# Patient Record
Sex: Female | Born: 1975 | Race: White | Hispanic: No | Marital: Married | Smoking: Never smoker
Health system: Southern US, Community
[De-identification: ages and names within clinical notes are randomized; demographics above are authoritative.]

## PROBLEM LIST (undated history)

## (undated) DIAGNOSIS — F329 Major depressive disorder, single episode, unspecified: Secondary | ICD-10-CM

## (undated) DIAGNOSIS — K219 Gastro-esophageal reflux disease without esophagitis: Secondary | ICD-10-CM

## (undated) DIAGNOSIS — F419 Anxiety disorder, unspecified: Secondary | ICD-10-CM

## (undated) DIAGNOSIS — G8929 Other chronic pain: Secondary | ICD-10-CM

## (undated) DIAGNOSIS — E119 Type 2 diabetes mellitus without complications: Secondary | ICD-10-CM

## (undated) DIAGNOSIS — F32A Depression, unspecified: Secondary | ICD-10-CM

## (undated) DIAGNOSIS — I10 Essential (primary) hypertension: Secondary | ICD-10-CM

## (undated) HISTORY — PX: ABDOMINAL HYSTERECTOMY: SHX81

## (undated) HISTORY — PX: CERVIX REMOVAL: SHX592

---

## 2004-12-06 ENCOUNTER — Other Ambulatory Visit: Payer: Self-pay

## 2004-12-06 ENCOUNTER — Emergency Department: Payer: Self-pay | Admitting: Emergency Medicine

## 2006-04-20 ENCOUNTER — Emergency Department: Payer: Self-pay | Admitting: Emergency Medicine

## 2007-01-03 ENCOUNTER — Inpatient Hospital Stay: Payer: Self-pay

## 2007-07-14 ENCOUNTER — Emergency Department: Payer: Self-pay | Admitting: Emergency Medicine

## 2007-07-15 ENCOUNTER — Emergency Department: Payer: Self-pay | Admitting: Emergency Medicine

## 2007-07-20 ENCOUNTER — Emergency Department: Payer: Self-pay | Admitting: Emergency Medicine

## 2008-04-29 ENCOUNTER — Ambulatory Visit: Payer: Self-pay | Admitting: Obstetrics & Gynecology

## 2008-05-06 ENCOUNTER — Ambulatory Visit: Payer: Self-pay | Admitting: Obstetrics & Gynecology

## 2008-11-29 ENCOUNTER — Emergency Department: Payer: Self-pay | Admitting: Emergency Medicine

## 2008-12-02 ENCOUNTER — Ambulatory Visit: Payer: Self-pay | Admitting: Obstetrics & Gynecology

## 2008-12-31 ENCOUNTER — Emergency Department: Payer: Self-pay

## 2009-01-25 ENCOUNTER — Ambulatory Visit: Payer: Self-pay | Admitting: Obstetrics & Gynecology

## 2009-01-27 ENCOUNTER — Ambulatory Visit: Payer: Self-pay | Admitting: Obstetrics & Gynecology

## 2010-08-24 IMAGING — US US PELV - US TRANSVAGINAL
1 series · 17 of 25 positions shown · non-contrast
Comparison: none

REASON FOR EXAM: persistant pelvic pain - cramping (like contractions),
hx of BTL, Uterine lining
COMMENTS:

[Series 1: us pelv - us transvaginal · 17 of 71 slices shown]
[im 1/71]
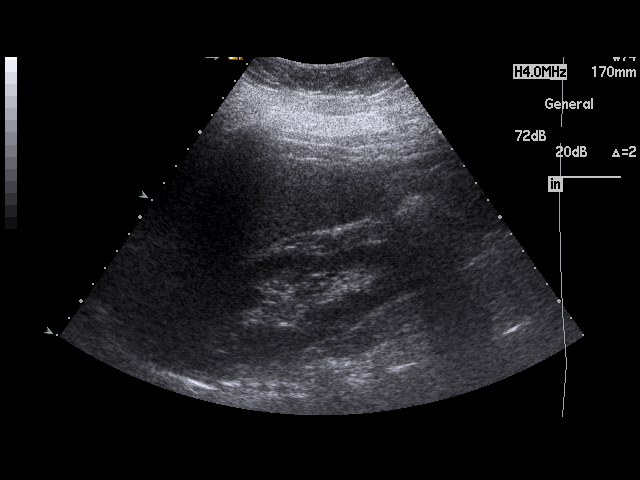
[im 6/71]
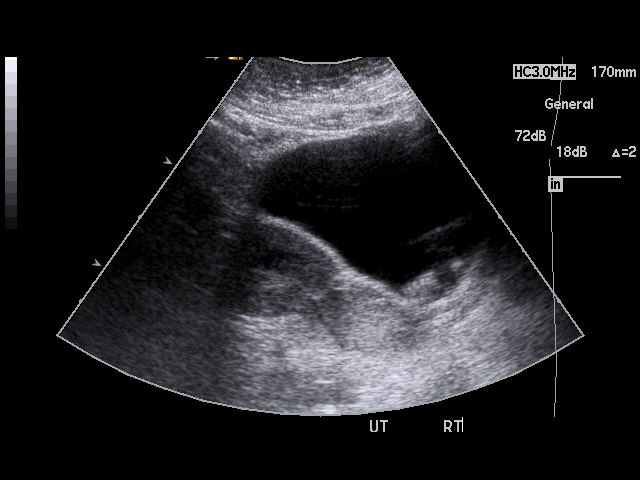
[im 9/71]
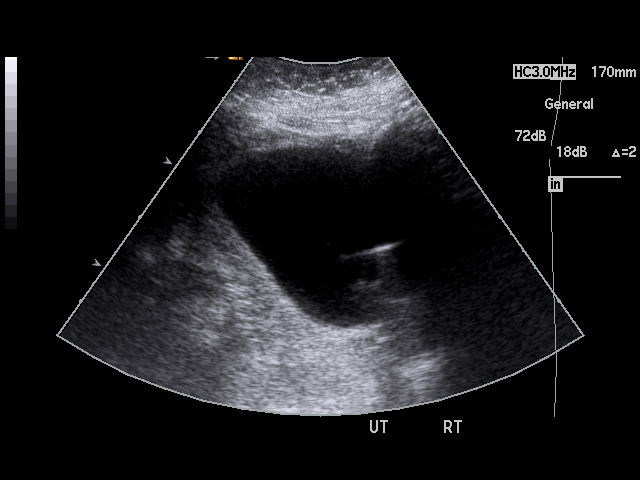
[im 15/71]
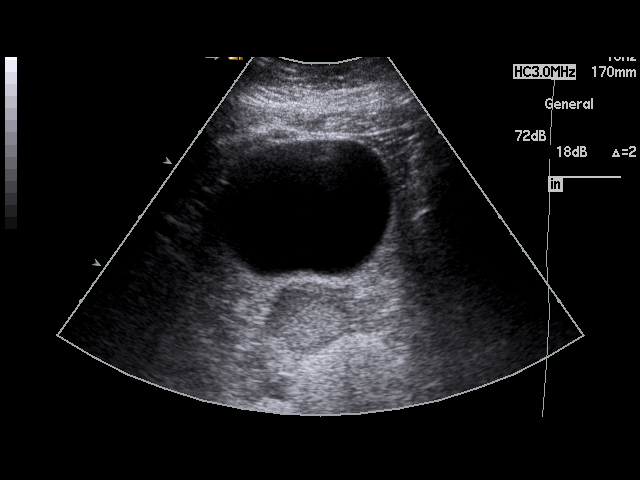
[im 18/71]
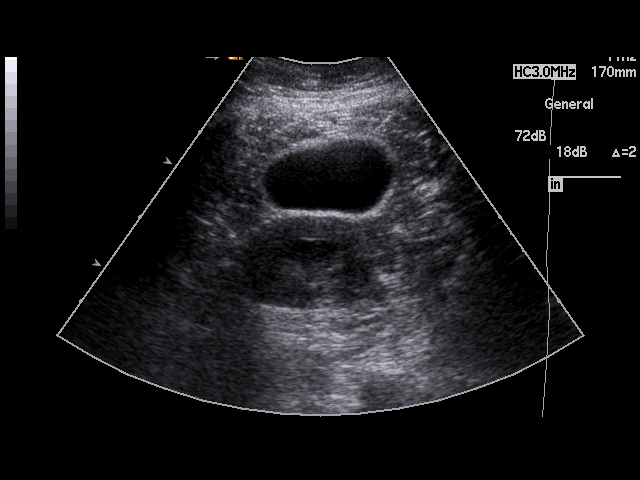
[im 24/71]
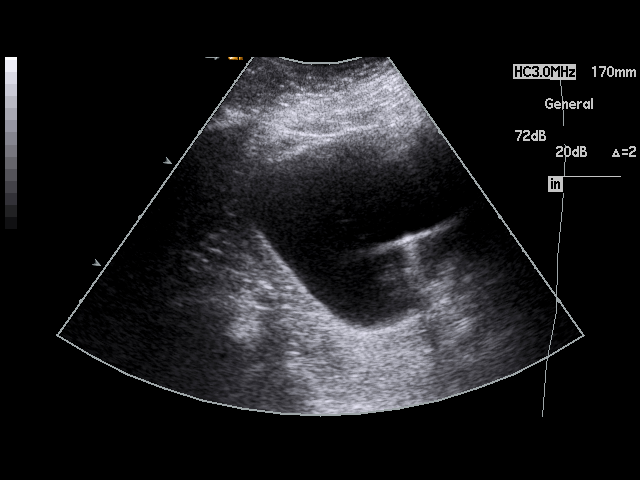
[im 27/71]
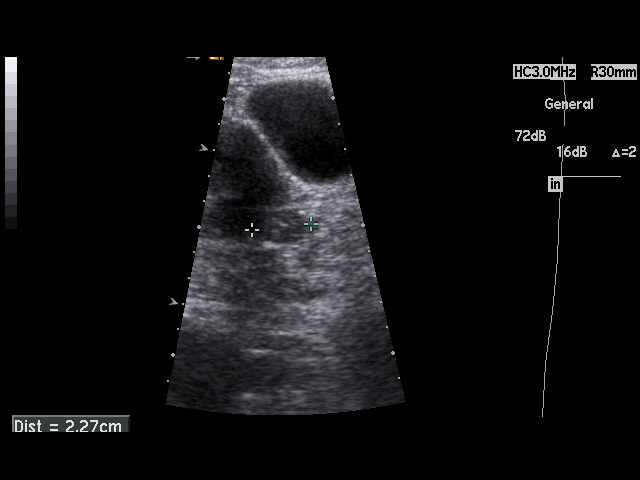
[im 33/71]
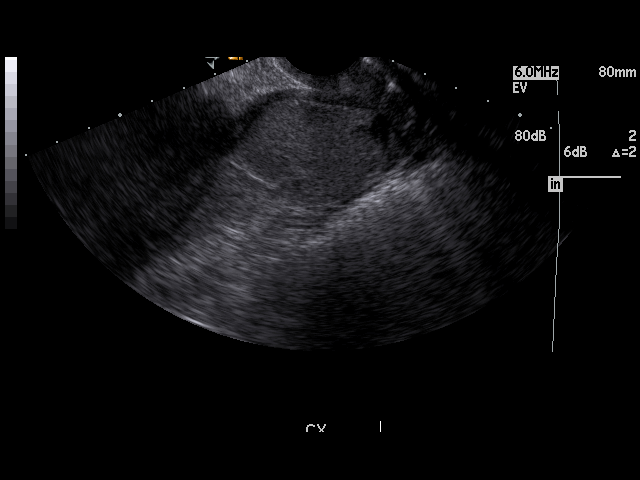
[im 36/71]
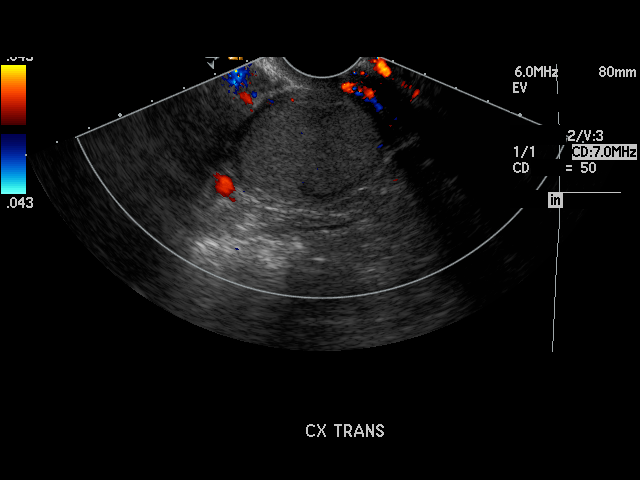
[im 38/71]
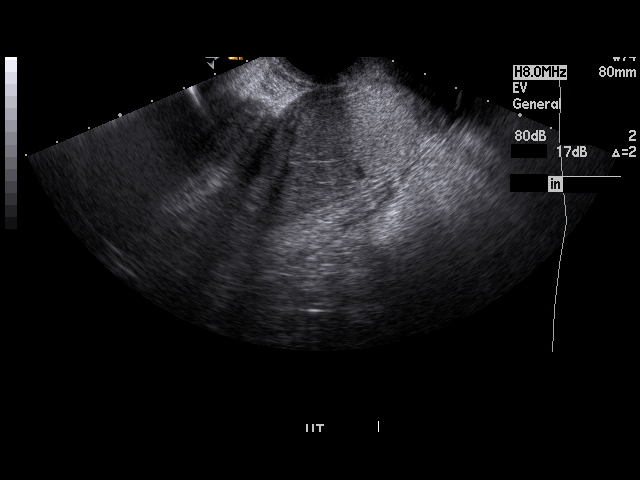
[im 44/71]
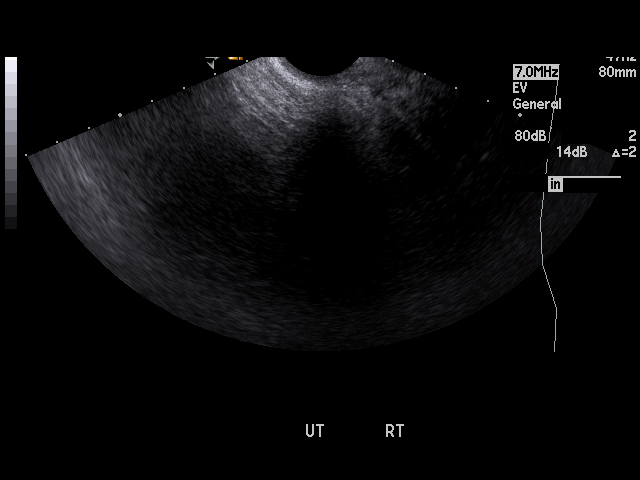
[im 47/71]
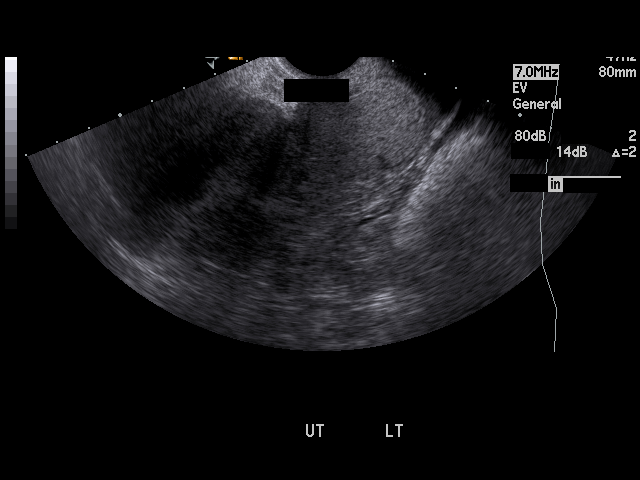
[im 53/71]
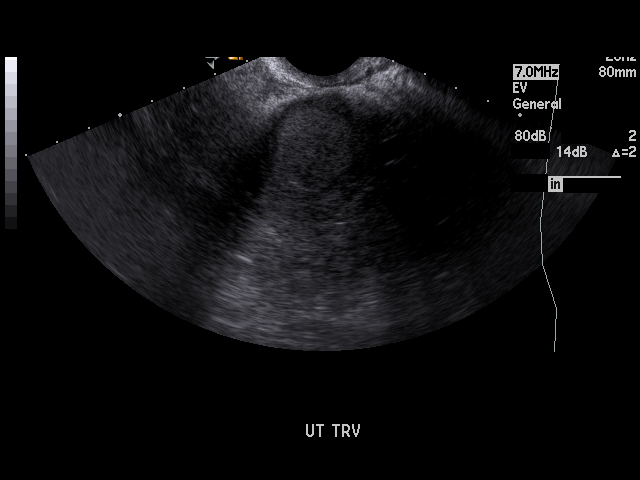
[im 56/71]
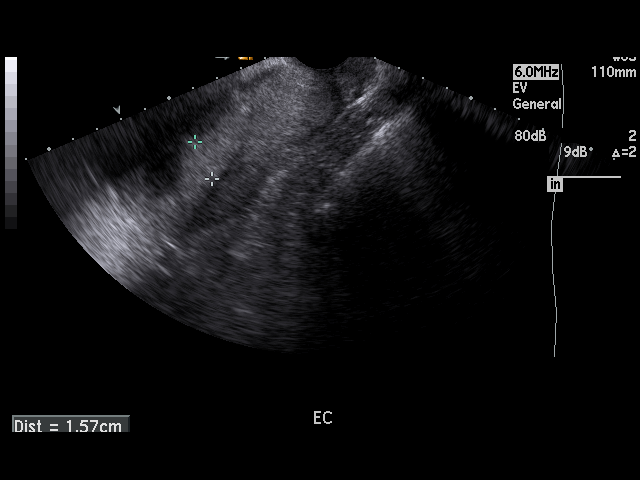
[im 62/71]
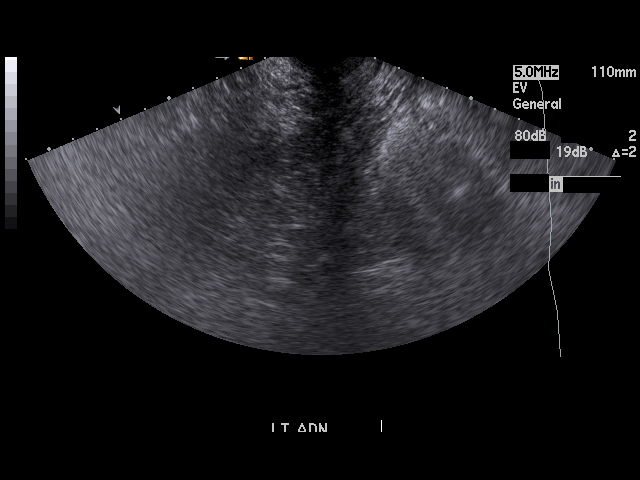
[im 65/71]
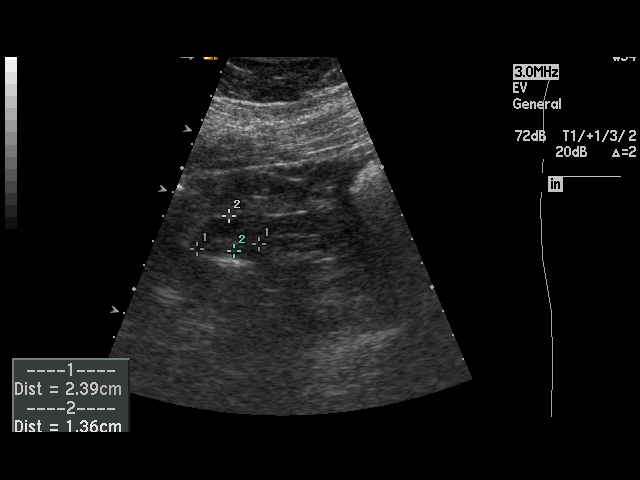
[im 71/71]
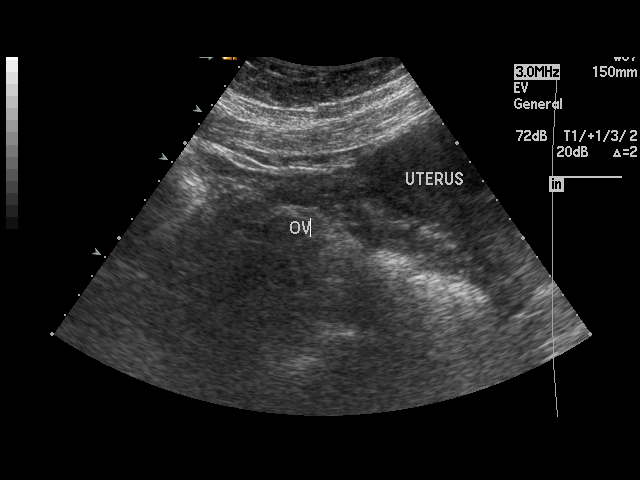

[17 of 25 positions shown; findings below may reference images not displayed]

PROCEDURE:     US  - US PELVIS EXAM W/TRANSVAGINAL  - November 29, 2008  [DATE]

RESULT:     There is an abnormal appearance of the uterus. The endometrium
appears thickened and there is fluid in the endometrial cavity. A fluid
fluid level is demonstrated. The uterus measures 10.4 x 4.8 x 5.5 cm. There
is no free fluid in the cul-de-sac.

The ovaries are normal in echotexture with the right ovary measuring 2.4 by
2. 7 x 1.4 cm. The left ovary measures 2.9 x 2.3 x 1.7 cm.
IMPRESSION: There is an abnormal appearance of the uterus with
thickening of the endometrium as well is the presence of fluid in the
endometrial cavity. This is presumably blood or blood products. No adnexal
abnormalities are demonstrated. GYN evaluation is recommended.

## 2011-02-05 ENCOUNTER — Ambulatory Visit: Payer: Self-pay | Admitting: Obstetrics & Gynecology

## 2011-02-13 ENCOUNTER — Ambulatory Visit: Payer: Self-pay | Admitting: Obstetrics & Gynecology

## 2011-02-14 LAB — PATHOLOGY REPORT

## 2012-07-21 ENCOUNTER — Emergency Department: Payer: Self-pay | Admitting: Emergency Medicine

## 2013-03-10 ENCOUNTER — Emergency Department: Payer: Self-pay | Admitting: Emergency Medicine

## 2014-04-01 ENCOUNTER — Ambulatory Visit: Payer: Self-pay | Admitting: Rheumatology

## 2014-12-03 IMAGING — CR LEFT WRIST - COMPLETE 3+ VIEW
1 series · 4 of 4 positions shown · non-contrast
Comparison: None.

CLINICAL DATA: Fall with pain

EXAM:
LEFT WRIST - COMPLETE 3+ VIEW

[Series 1: x wrist pa left · 0.14mm/px · 4 of 4 slices shown]
[im 1/4]
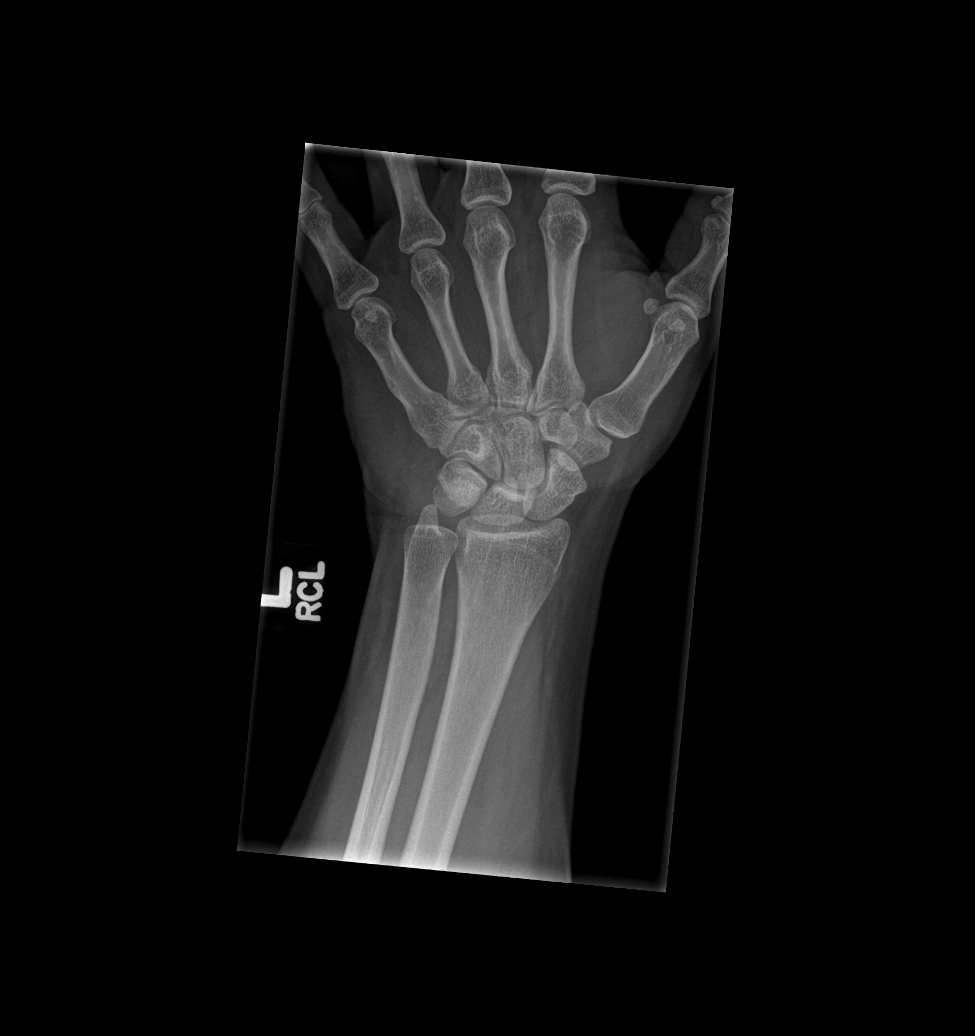
[im 2/4]
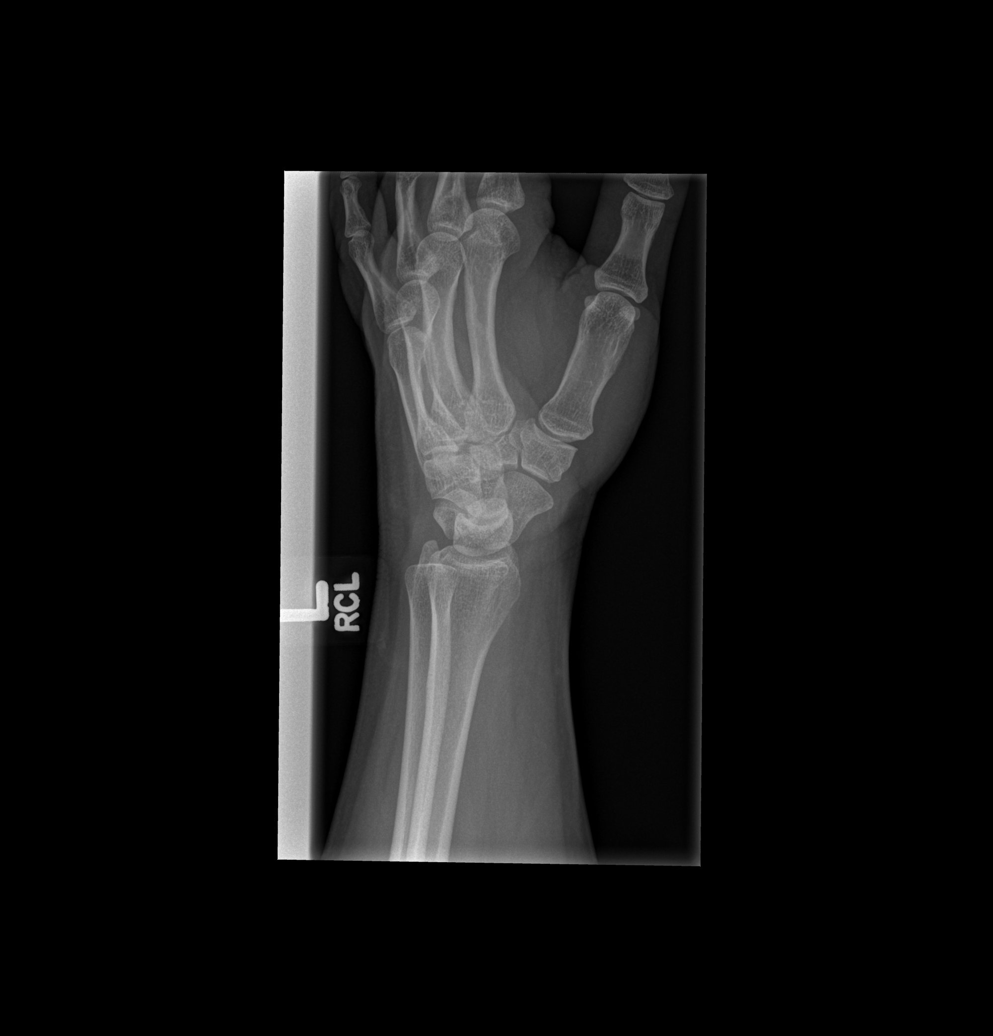
[im 3/4]
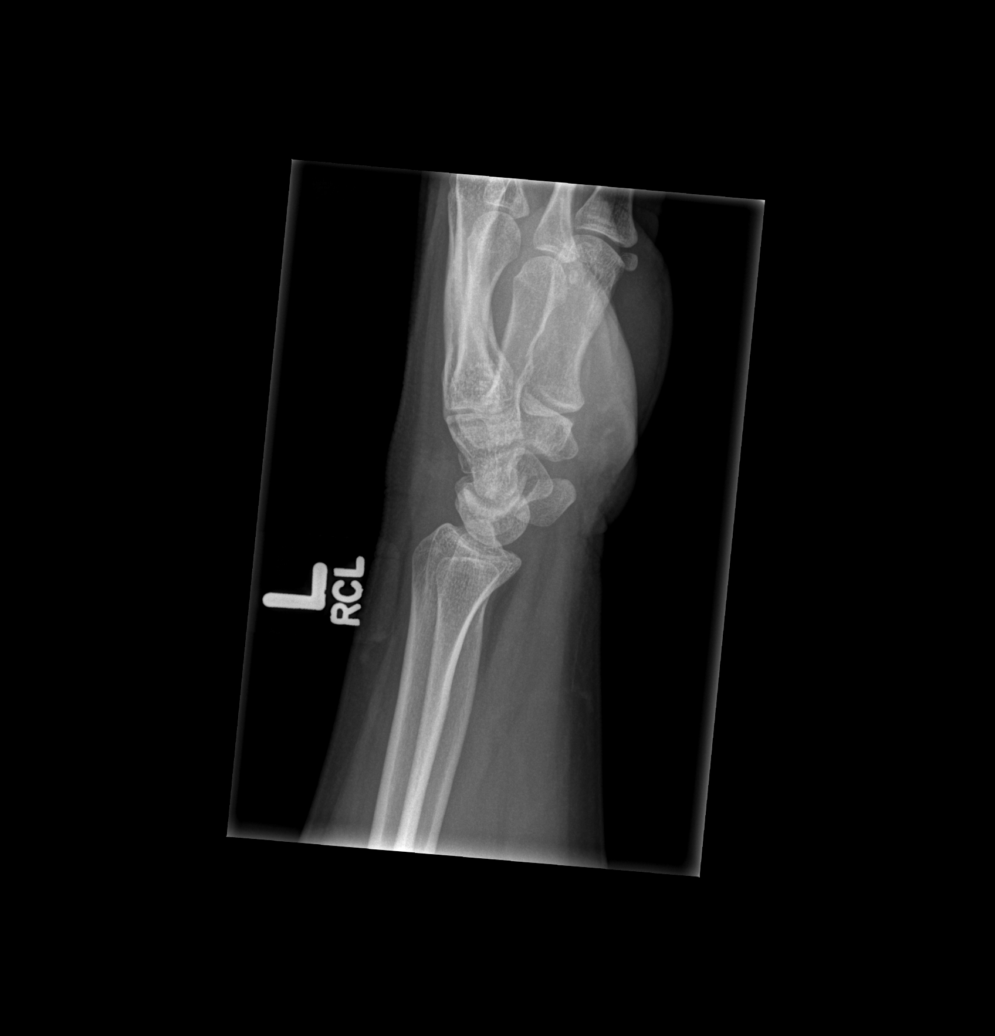
[im 4/4]
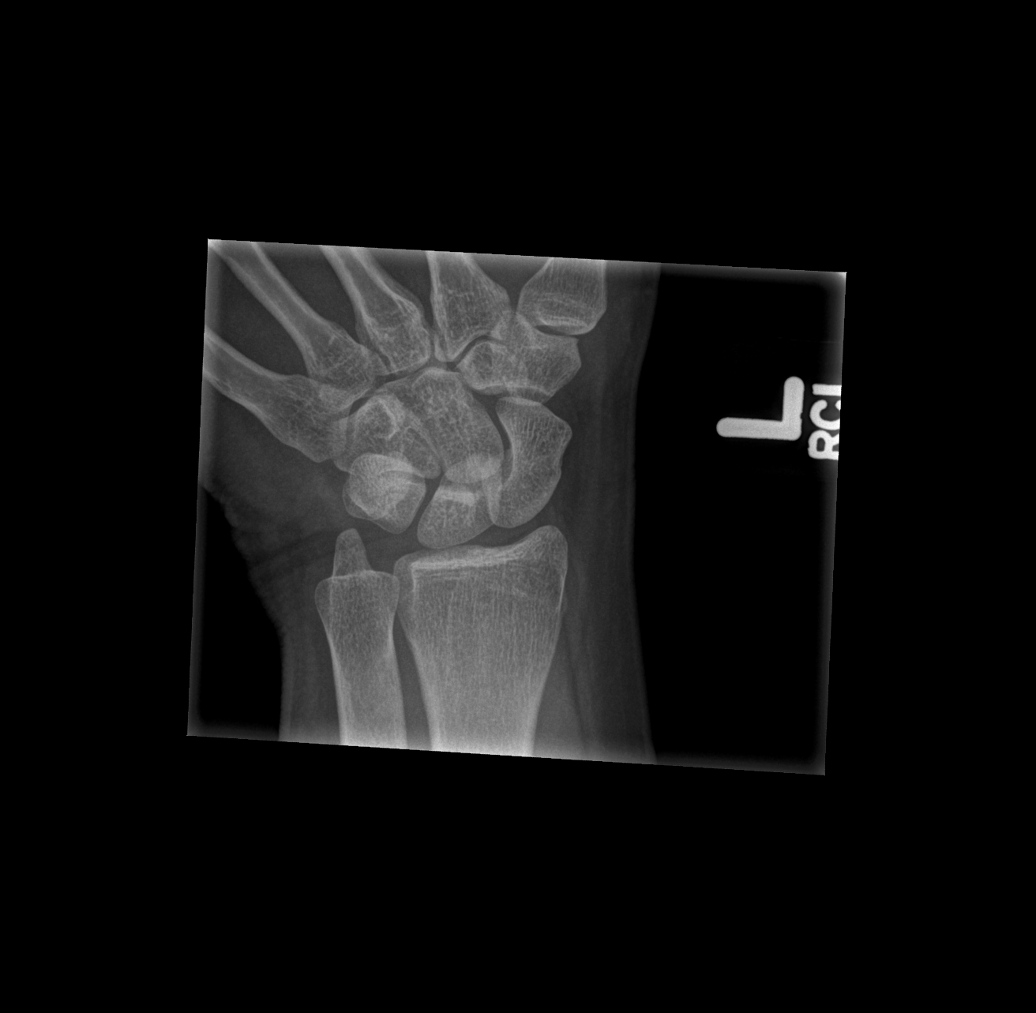

[4 of 4 positions shown; findings below may reference images not displayed]

FINDINGS: There is no evidence of fracture or dislocation. There is no
evidence of arthropathy or other focal bone abnormality. Soft
tissues are unremarkable.
IMPRESSION: Negative.

## 2014-12-03 IMAGING — CR DG HUMERUS 2V *R*
1 series · 3 of 3 positions shown · non-contrast
Comparison: None.

CLINICAL DATA: Fall with trauma and pain

EXAM:
RIGHT HUMERUS - 2+ VIEW

[Series 1: w humerus ap right · 0.14mm/px · 3 of 3 slices shown]
[im 1/3]
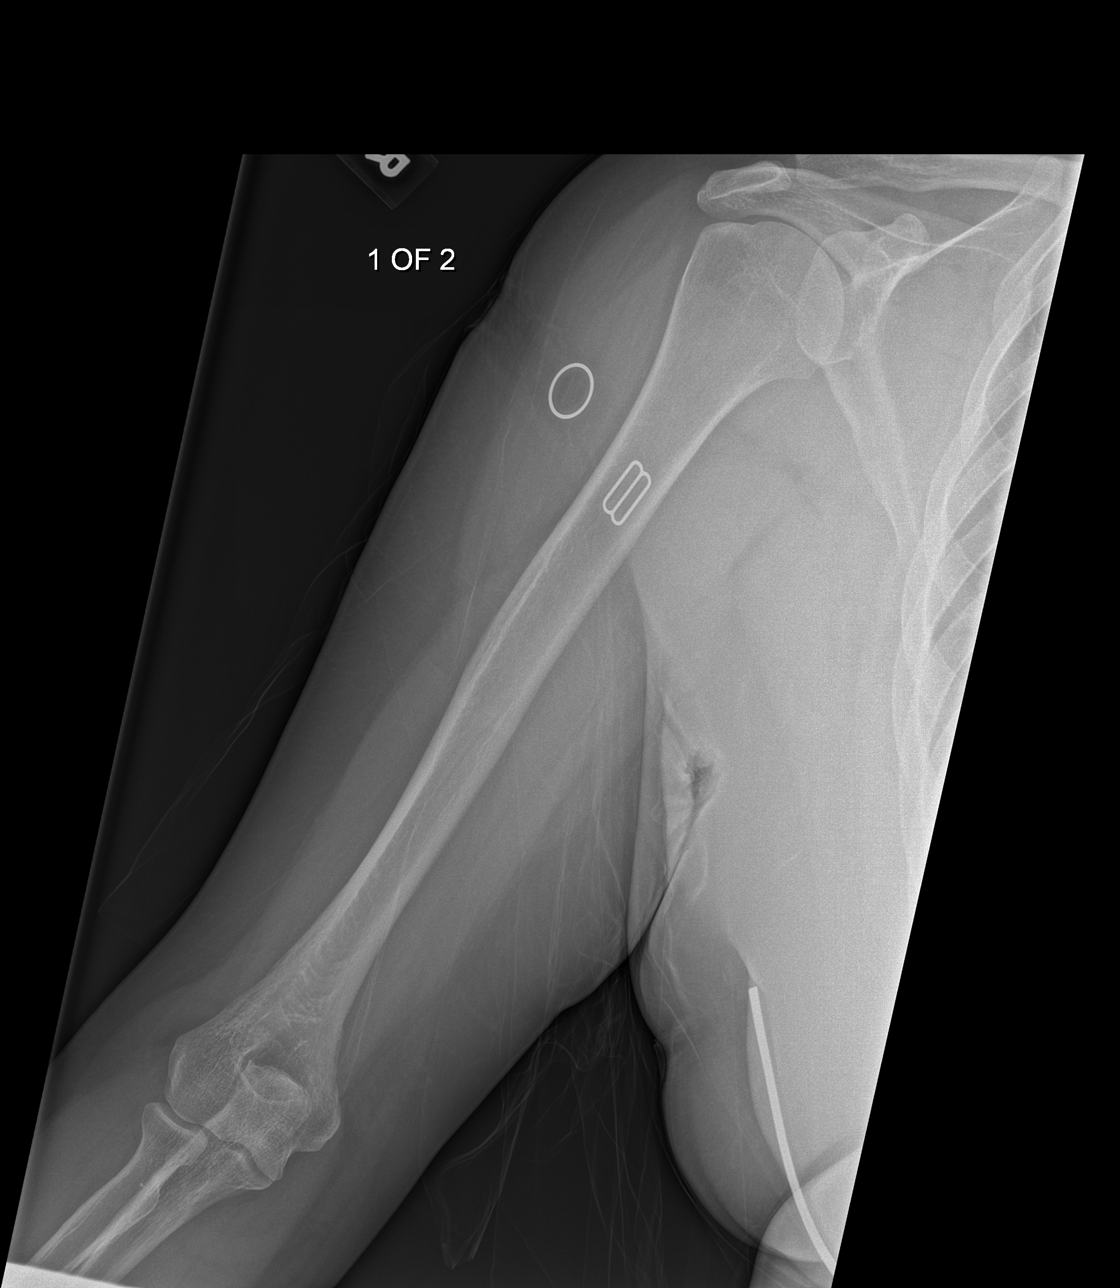
[im 2/3]
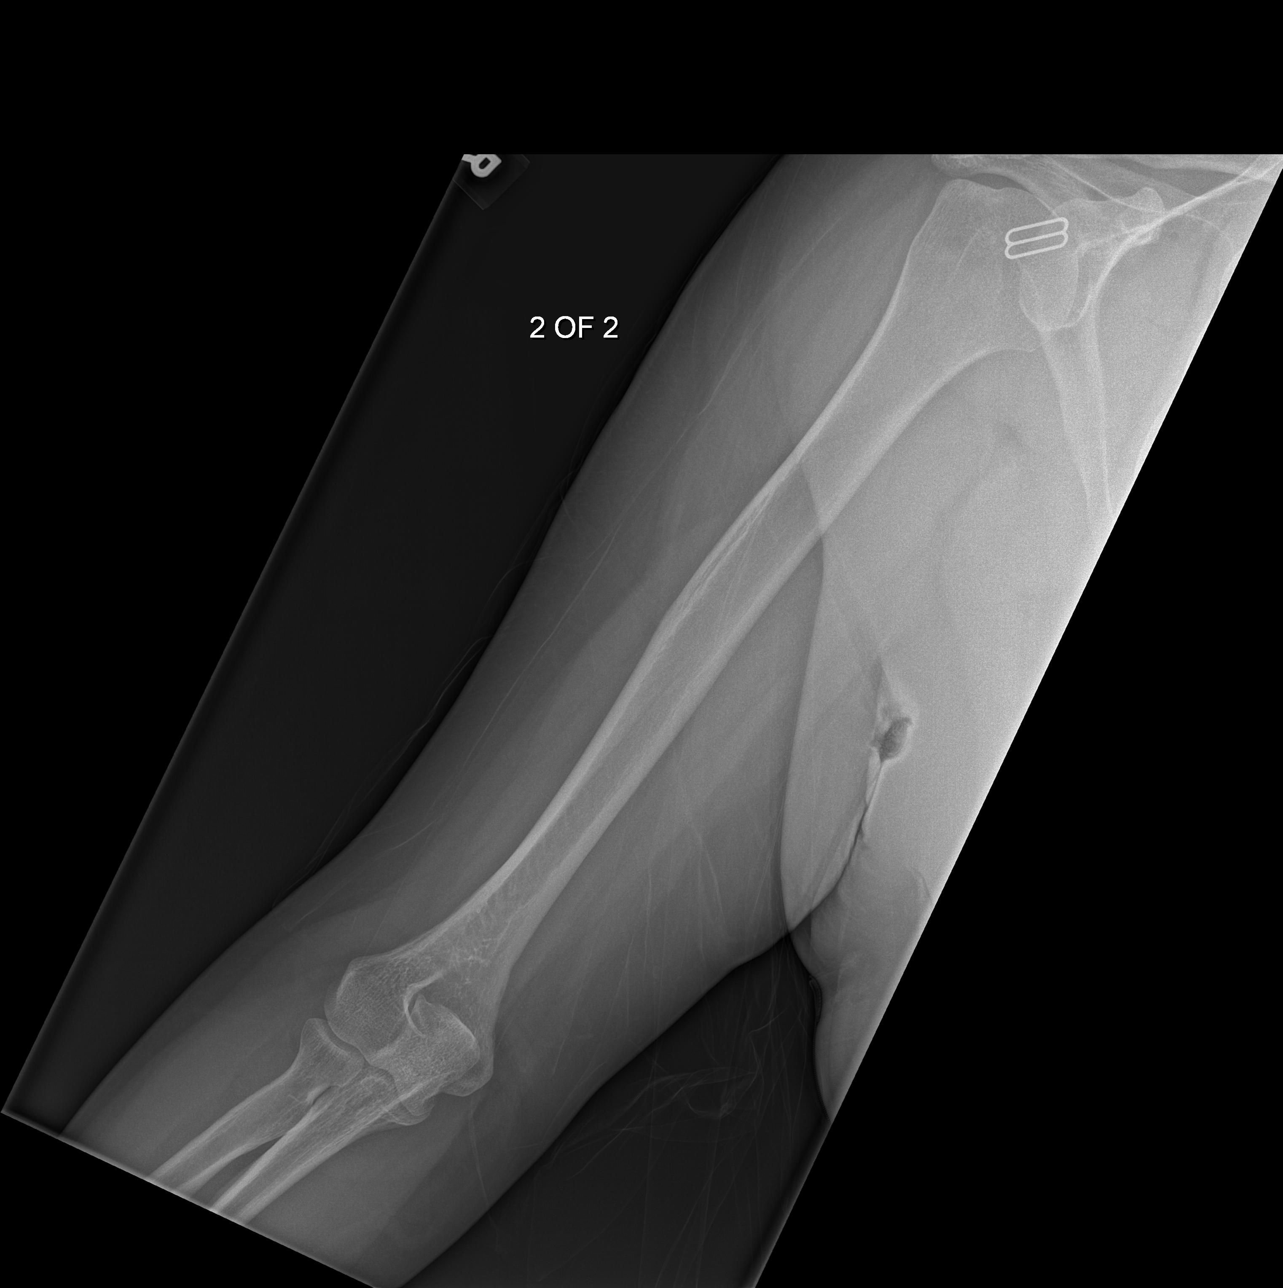
[im 3/3]
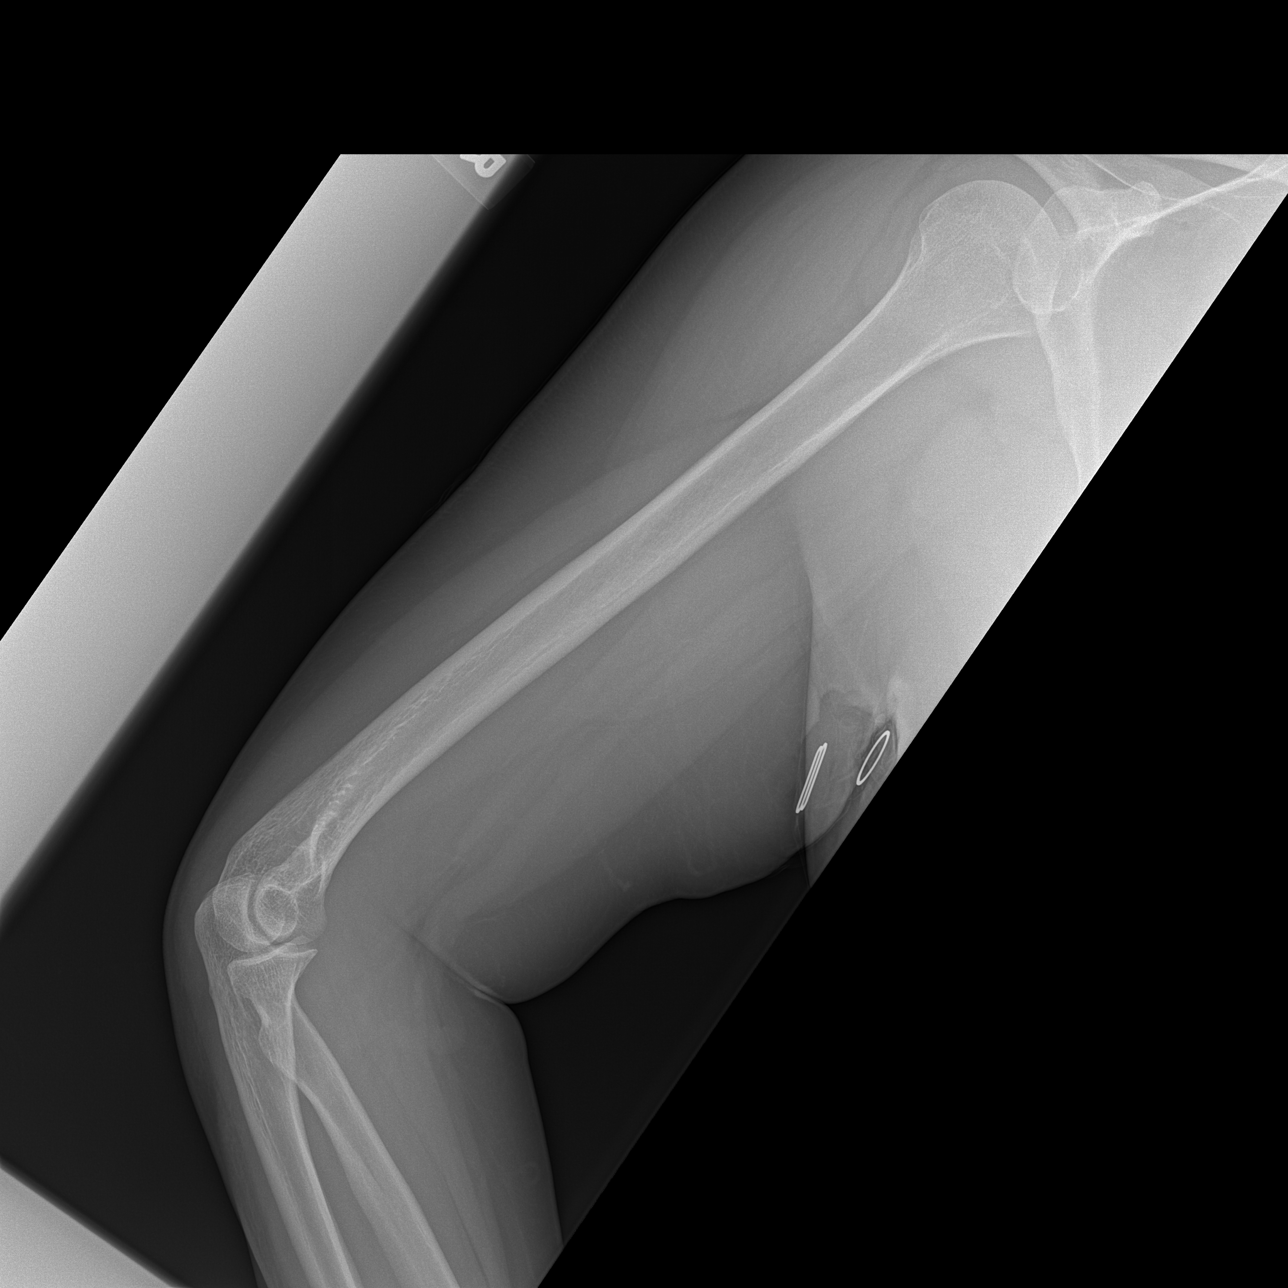

[3 of 3 positions shown; findings below may reference images not displayed]

FINDINGS: No evidence of fracture or other focal lesion.
IMPRESSION: Negative

## 2016-06-08 ENCOUNTER — Encounter: Payer: Self-pay | Admitting: Emergency Medicine

## 2016-06-08 ENCOUNTER — Emergency Department: Payer: Self-pay

## 2016-06-08 ENCOUNTER — Emergency Department
Admission: EM | Admit: 2016-06-08 | Discharge: 2016-06-08 | Disposition: A | Discharge status: Home / Self Care | Payer: Self-pay | Attending: Emergency Medicine | Admitting: Emergency Medicine

## 2016-06-08 DIAGNOSIS — I10 Essential (primary) hypertension: Secondary | ICD-10-CM | POA: Insufficient documentation

## 2016-06-08 DIAGNOSIS — E119 Type 2 diabetes mellitus without complications: Secondary | ICD-10-CM | POA: Insufficient documentation

## 2016-06-08 DIAGNOSIS — R079 Chest pain, unspecified: Secondary | ICD-10-CM

## 2016-06-08 DIAGNOSIS — M940 Chondrocostal junction syndrome [Tietze]: Secondary | ICD-10-CM | POA: Insufficient documentation

## 2016-06-08 HISTORY — DX: Depression, unspecified: F32.A

## 2016-06-08 HISTORY — DX: Anxiety disorder, unspecified: F41.9

## 2016-06-08 HISTORY — DX: Gastro-esophageal reflux disease without esophagitis: K21.9

## 2016-06-08 HISTORY — DX: Essential (primary) hypertension: I10

## 2016-06-08 HISTORY — DX: Major depressive disorder, single episode, unspecified: F32.9

## 2016-06-08 HISTORY — DX: Type 2 diabetes mellitus without complications: E11.9

## 2016-06-08 LAB — URINALYSIS, COMPLETE (UACMP) WITH MICROSCOPIC
Bacteria, UA: NONE SEEN
Bilirubin Urine: NEGATIVE
GLUCOSE, UA: NEGATIVE mg/dL
HGB URINE DIPSTICK: NEGATIVE
Ketones, ur: NEGATIVE mg/dL
Leukocytes, UA: NEGATIVE
Nitrite: NEGATIVE
PROTEIN: NEGATIVE mg/dL
Specific Gravity, Urine: 1.02 (ref 1.005–1.030)
pH: 6 (ref 5.0–8.0)

## 2016-06-08 LAB — CBC
HCT: 37.8 % (ref 35.0–47.0)
Hemoglobin: 13.1 g/dL (ref 12.0–16.0)
MCH: 26.9 pg (ref 26.0–34.0)
MCHC: 34.7 g/dL (ref 32.0–36.0)
MCV: 77.6 fL — ABNORMAL LOW (ref 80.0–100.0)
Platelets: 250 10*3/uL (ref 150–440)
RBC: 4.87 MIL/uL (ref 3.80–5.20)
RDW: 15.7 % — ABNORMAL HIGH (ref 11.5–14.5)
WBC: 10.4 10*3/uL (ref 3.6–11.0)

## 2016-06-08 LAB — BASIC METABOLIC PANEL
Anion gap: 6 (ref 5–15)
BUN: 12 mg/dL (ref 6–20)
CO2: 29 mmol/L (ref 22–32)
Calcium: 8.7 mg/dL — ABNORMAL LOW (ref 8.9–10.3)
Chloride: 104 mmol/L (ref 101–111)
Creatinine, Ser: 0.59 mg/dL (ref 0.44–1.00)
GFR calc Af Amer: >60 mL/min (ref >60)
GFR calc non Af Amer: >60 mL/min (ref >60)
Glucose, Bld: 140 mg/dL — ABNORMAL HIGH (ref 65–99)
Potassium: 4.2 mmol/L (ref 3.5–5.1)
Sodium: 139 mmol/L (ref 135–145)

## 2016-06-08 LAB — TROPONIN I: Troponin I: <0.03 ng/mL (ref <0.03)

## 2016-06-08 MED ORDER — KETOROLAC TROMETHAMINE 30 MG/ML IJ SOLN: 10.0000 mg | Freq: Once | INTRAMUSCULAR | Status: DC

## 2016-06-08 MED ORDER — KETOROLAC TROMETHAMINE 60 MG/2ML IM SOLN
INTRAMUSCULAR | Status: AC
  Administered 2016-06-08: 9.9 mg
  Filled 2016-06-08: qty 2

## 2016-06-08 MED ORDER — LORAZEPAM 2 MG/ML IJ SOLN
1.0000 mg | Freq: Once | INTRAMUSCULAR | Status: AC
  Administered 2016-06-08: 1 mg via INTRAVENOUS
  Filled 2016-06-08: qty 1

## 2016-06-08 MED ORDER — IBUPROFEN 800 MG PO TABS: 800.0000 mg | ORAL_TABLET | Freq: Three times a day (TID) | ORAL | 0 refills | Status: AC | PRN

## 2016-06-08 NOTE — Discharge Instructions (Signed)
1. Resume your anxiety and depression medicines. Take these in the morning after you get off of work. 2. You may take ibuprofen as needed for chest discomfort. 3. Apply moist heat to affected area several times daily. 4. Return to the ER for worsening symptoms, persistent vomiting, difficulty breathing or other concerns.

## 2016-06-08 NOTE — ED Provider Notes (Signed)
[facility] Emergency Department Provider Note   ____________________________________________   First MD Initiated Contact with Patient 06/08/16 0301     (approximate)  I have reviewed the triage vital signs and the nursing notes.   HISTORY  Chief Complaint Chest Pain    HPI Jasmine Duncan is a 41 y.o. female brought to the ED via EMS from [employer] where she works overnight. Patient reports left-sided chest pain which has been constant for the past 3 days. Describes aching type pain which radiates to her left arm. Symptoms associated with nausea only. Denies diaphoresis, shortness of breath, vomiting, palpitations or dizziness. Also complains of headache to left temple and left flank pain. Had a nonproductive cough last week but none this week. Movement and deep breathing makes her pain worse. Denies associated fever, chills, dysuria, diarrhea. Denies recent travel or trauma. Denies hormone use. Reports she started working overnight 2 months ago and has not been taking her medicines for anxiety and depression because she did not know how to adjust them to her new sleep schedule.   Past Medical History:  Diagnosis Date  . Anxiety   . Depression   . Diabetes mellitus without complication (HCC)   . GERD (gastroesophageal reflux disease)   . Hypertension     There are no active problems to display for this patient.   Past Surgical History:  Procedure Laterality Date  . ABDOMINAL HYSTERECTOMY    . CERVIX REMOVAL      Prior to Admission medications   Medication Sig Start Date End Date Taking? Authorizing Provider  ibuprofen (ADVIL,MOTRIN) 800 MG tablet Take 1 tablet (800 mg total) by mouth every 8 (eight) hours as needed for moderate pain. 06/08/16   Irean Hong, MD    Allergies Patient has no known allergies.  No family history on file.  Social History Social History  Substance Use Topics  . Smoking status: Never Smoker  . Smokeless  tobacco: Never Used  . Alcohol use No    Review of Systems  Constitutional: No fever/chills. Eyes: No visual changes. ENT: No sore throat. Cardiovascular: Positive for chest pain. Respiratory: Denies shortness of breath. Gastrointestinal: No abdominal pain.  Positive for nausea, no vomiting.  No diarrhea.  No constipation. Genitourinary: Negative for dysuria. Musculoskeletal: Positive for back pain. Skin: Negative for rash. Neurological: Negative for headaches, focal weakness or numbness. Psychiatric:Positive for anxiety.  10-point ROS otherwise negative.  ____________________________________________   PHYSICAL EXAM:  VITAL SIGNS: ED Triage Vitals  Enc Vitals Group     BP 06/08/16 0233 (!) 150/103     Pulse Rate 06/08/16 0233 96     Resp 06/08/16 0233 19     Temp 06/08/16 0233 99 F (37.2 C)     Temp Source 06/08/16 0233 Oral     SpO2 06/08/16 0233 99 %     Weight 06/08/16 0230 254 lb (115.2 kg)     Height 06/08/16 0230  (1.727 m)     Head Circumference --      Peak Flow --      Pain Score 06/08/16 0229 8     Pain Loc --      Pain Edu? --      Excl. in GC? --     Constitutional: Alert and oriented. Well appearing and in no acute distress. Mildly anxious. Eyes: Conjunctivae are normal. PERRL. EOMI. Head: Atraumatic. Nose: No congestion/rhinnorhea. Mouth/Throat: Mucous membranes are moist.  Oropharynx non-erythematous. Neck: No stridor.  Cardiovascular: Normal rate, regular rhythm. Grossly normal heart sounds.  Good peripheral circulation. Respiratory: Normal respiratory effort.  No retractions. Lungs CTAB. Gastrointestinal: Soft and nontender. No distention. No abdominal bruits. No CVA tenderness. Musculoskeletal: No lower extremity tenderness nor edema.  No joint effusions. Neurologic:  Normal speech and language. No gross focal neurologic deficits are appreciated.  Skin:  Skin is warm, dry and intact. No rash noted. Psychiatric: Mood and affect are  anxious. Speech and behavior are normal.  ____________________________________________   LABS (all labs ordered are listed, but only abnormal results are displayed)  Labs Reviewed  BASIC METABOLIC PANEL - Abnormal; Notable for the following:       Result Value   Glucose, Bld 140 (*)    Calcium 8.7 (*)    All other components within normal limits  CBC - Abnormal; Notable for the following:    MCV 77.6 (*)    RDW 15.7 (*)    All other components within normal limits  URINALYSIS, COMPLETE (UACMP) WITH MICROSCOPIC - Abnormal; Notable for the following:    Color, Urine YELLOW (*)    APPearance CLEAR (*)    Squamous Epithelial / LPF 0-5 (*)    All other components within normal limits  TROPONIN I   ____________________________________________  EKG  ED ECG REPORT I, Emanuella Nickle J, the attending physician, personally viewed and interpreted this ECG.   Date: 06/08/2016  EKG Time: 0233  Rate: 86  Rhythm: normal EKG, normal sinus rhythm  Axis: Normal  Intervals:none  ST&T Change: Nonspecific  ____________________________________________  RADIOLOGY  Chest x-ray interpreted per Dr. Manus Gunning: Hypoventilatory chest with bronchovascular crowding. ____________________________________________   PROCEDURES  Procedure(s) performed: None  Procedures  Critical Care performed: No  ____________________________________________   INITIAL IMPRESSION / ASSESSMENT AND PLAN / ED COURSE  Pertinent labs & imaging results that were available during my care of the patient were reviewed by me and considered in my medical decision making (see chart for details).  41 year old female who presents with a three-day history of constant chest pain. Also with recent nonproductive cough and discontinuation of anxiety/depression medications. Initial EKG is unremarkable. Low suspicion for ACS, PE, dissection. Will check troponin, chest x-ray and administer analgesia as well as anxiolytic.  Clinical  Course as of Jun 09 455  Fri Jun 08, 2016  9147 Patient soundly asleep. Updated her of laboratory, urinalysis and imaging studies. Strict return precautions given. Patient verbalizes understanding and agrees with plan of care.  [JS]    Clinical Course User Index [JS] Irean Hong, MD     ____________________________________________   FINAL CLINICAL IMPRESSION(S) / ED DIAGNOSES  Final diagnoses:  Nonspecific chest pain  Costochondritis      NEW MEDICATIONS STARTED DURING THIS VISIT:  New Prescriptions   IBUPROFEN (ADVIL,MOTRIN) 800 MG TABLET    Take 1 tablet (800 mg total) by mouth every 8 (eight) hours as needed for moderate pain.     Note:  This document was prepared using Dragon voice recognition software and may include unintentional dictation errors.    Irean Hong, MD 06/08/16 [id number]

## 2016-06-08 NOTE — ED Triage Notes (Signed)
patient comes in from [place] where she works [employer] via [employer]. patient having chest pain for 3 days that radiates to left arm. Patient does have h of depression, anxiety, HTN, DM, GERD. Patient also having headache to the left temple, pain in the left side that goes around to the back and nausea. Patient states the pain is worse with a deep breath and does have a cough.

## 2016-09-30 ENCOUNTER — Encounter: Payer: Self-pay | Admitting: Emergency Medicine

## 2016-09-30 ENCOUNTER — Emergency Department: Payer: BLUE CROSS/BLUE SHIELD

## 2016-09-30 ENCOUNTER — Emergency Department
Admission: EM | Admit: 2016-09-30 | Discharge: 2016-09-30 | Disposition: A | Discharge status: Home / Self Care | Payer: BLUE CROSS/BLUE SHIELD | Attending: Emergency Medicine | Admitting: Emergency Medicine

## 2016-09-30 DIAGNOSIS — R109 Unspecified abdominal pain: Secondary | ICD-10-CM | POA: Diagnosis not present

## 2016-09-30 DIAGNOSIS — R1011 Right upper quadrant pain: Secondary | ICD-10-CM | POA: Diagnosis present

## 2016-09-30 DIAGNOSIS — K625 Hemorrhage of anus and rectum: Secondary | ICD-10-CM | POA: Diagnosis not present

## 2016-09-30 DIAGNOSIS — I10 Essential (primary) hypertension: Secondary | ICD-10-CM | POA: Insufficient documentation

## 2016-09-30 DIAGNOSIS — E119 Type 2 diabetes mellitus without complications: Secondary | ICD-10-CM | POA: Diagnosis not present

## 2016-09-30 DIAGNOSIS — Z7984 Long term (current) use of oral hypoglycemic drugs: Secondary | ICD-10-CM | POA: Insufficient documentation

## 2016-09-30 DIAGNOSIS — Z79899 Other long term (current) drug therapy: Secondary | ICD-10-CM | POA: Insufficient documentation

## 2016-09-30 LAB — COMPREHENSIVE METABOLIC PANEL WITH GFR
ALT: 24 U/L (ref 14–54)
AST: 20 U/L (ref 15–41)
Albumin: 3.7 g/dL (ref 3.5–5.0)
Alkaline Phosphatase: 69 U/L (ref 38–126)
Anion gap: 6 (ref 5–15)
BUN: 19 mg/dL (ref 6–20)
CO2: 28 mmol/L (ref 22–32)
Calcium: 9.1 mg/dL (ref 8.9–10.3)
Chloride: 105 mmol/L (ref 101–111)
Creatinine, Ser: 0.64 mg/dL (ref 0.44–1.00)
GFR calc Af Amer: >60 mL/min (ref >60)
GFR calc non Af Amer: >60 mL/min (ref >60)
Glucose, Bld: 96 mg/dL (ref 65–99)
Potassium: 3.7 mmol/L (ref 3.5–5.1)
Sodium: 139 mmol/L (ref 135–145)
Total Bilirubin: 0.1 mg/dL — ABNORMAL LOW (ref 0.3–1.2)
Total Protein: 7.3 g/dL (ref 6.5–8.1)

## 2016-09-30 LAB — URINALYSIS, COMPLETE (UACMP) WITH MICROSCOPIC
Bacteria, UA: NONE SEEN
Bilirubin Urine: NEGATIVE
Glucose, UA: NEGATIVE mg/dL
Hgb urine dipstick: NEGATIVE
Ketones, ur: NEGATIVE mg/dL
Leukocytes, UA: NEGATIVE
Nitrite: NEGATIVE
Protein, ur: NEGATIVE mg/dL
Specific Gravity, Urine: 1.032 — ABNORMAL HIGH (ref 1.005–1.030)
pH: 5 (ref 5.0–8.0)

## 2016-09-30 LAB — CBC
HEMATOCRIT: 35.7 % (ref 35.0–47.0)
Hemoglobin: 12.1 g/dL (ref 12.0–16.0)
MCH: 26.3 pg (ref 26.0–34.0)
MCHC: 34 g/dL (ref 32.0–36.0)
MCV: 77.3 fL — ABNORMAL LOW (ref 80.0–100.0)
Platelets: 230 10*3/uL (ref 150–440)
RBC: 4.62 MIL/uL (ref 3.80–5.20)
RDW: 15.8 % — AB (ref 11.5–14.5)
WBC: 9.4 10*3/uL (ref 3.6–11.0)

## 2016-09-30 LAB — TYPE AND SCREEN
ABO/RH(D): O POS
ANTIBODY SCREEN: NEGATIVE

## 2016-09-30 LAB — LIPASE, BLOOD: Lipase: 31 U/L (ref 11–51)

## 2016-09-30 MED ORDER — MORPHINE SULFATE (PF) 4 MG/ML IV SOLN
4.0000 mg | Freq: Once | INTRAVENOUS | Status: AC
  Administered 2016-09-30: 4 mg via INTRAVENOUS
  Filled 2016-09-30: qty 1

## 2016-09-30 MED ORDER — ONDANSETRON HCL 4 MG/2ML IJ SOLN
4.0000 mg | Freq: Once | INTRAMUSCULAR | Status: AC
  Administered 2016-09-30: 4 mg via INTRAVENOUS
  Filled 2016-09-30: qty 2

## 2016-09-30 MED ORDER — IOPAMIDOL (ISOVUE-300) INJECTION 61%
30.0000 mL | Freq: Once | INTRAVENOUS | Status: AC
  Administered 2016-09-30: 30 mL via ORAL

## 2016-09-30 MED ORDER — IOPAMIDOL (ISOVUE-370) INJECTION 76%
100.0000 mL | Freq: Once | INTRAVENOUS | Status: AC | PRN
  Administered 2016-09-30: 100 mL via INTRAVENOUS

## 2016-09-30 NOTE — ED Triage Notes (Signed)
Pt states when she woke up this morning, she noticed blood in her stool and in the toilet.  Pt c/o lower abdominal pain. Pt c/o RUQ pain for several months and is radiating to her back which she states the pain is worse.  Pt c/o recent episodes of vomiting and nausea.   Pt states her stool was brown with bright red blood in the toilet and when she wiped.  Pt states she has not had an episodes like this before.

## 2016-09-30 NOTE — ED Provider Notes (Signed)
[facility] Emergency Department Provider Note   ____________________________________________   First MD Initiated Contact with Patient 09/30/16 0932     (approximate)  I have reviewed the triage vital signs and the nursing notes.   HISTORY  Chief Complaint Rectal Bleeding and Abdominal Pain    HPI Jasmine Duncan is a 41 y.o. female who reports intermittent episodes of right upper quadrant pain and negative ultrasound. Last night she began having suprapubic pain as well. This morning she had worse crampy suprapubic pain and crampy right upper quadrant pain and then began having blood in her stool. She had 4 episodes of stool mixed with blood. She also had some nausea and vomiting yesterday. She has not had bloody stool or suprapubic pain in the past. She has had a hysterectomy and only her ovaries left in. She is sure the blood is coming from her rectum.   Past Medical History:  Diagnosis Date  . Anxiety   . Depression   . Diabetes mellitus without complication (HCC)   . GERD (gastroesophageal reflux disease)   . Hypertension     There are no active problems to display for this patient.   Past Surgical History:  Procedure Laterality Date  . ABDOMINAL HYSTERECTOMY    . CERVIX REMOVAL      Prior to Admission medications   Medication Sig Start Date End Date Taking? Authorizing Provider  escitalopram (LEXAPRO) 20 MG tablet Take 20 mg by mouth daily. 08/25/16  Yes [provider]  hydrochlorothiazide (HYDRODIURIL) 25 MG tablet Take 25 mg by mouth daily. 08/25/16  Yes [provider]  ibuprofen (ADVIL,MOTRIN) 800 MG tablet Take 1 tablet (800 mg total) by mouth every 8 (eight) hours as needed for moderate pain. 06/08/16  Yes Irean Hong, MD  metFORMIN (GLUCOPHAGE-XR) 500 MG 24 hr tablet Take 500 mg by mouth daily. 08/25/16  Yes [provider]  busPIRone (BUSPAR) 15 MG tablet Take 15 mg by mouth 2 (two) times daily. 07/24/16    [provider]    Allergies Patient has no known allergies.  History reviewed. No pertinent family history.  Social History Social History  Substance Use Topics  . Smoking status: Never Smoker  . Smokeless tobacco: Never Used  . Alcohol use No    Review of Systems  Constitutional: No fever/chills Eyes: No visual changes. ENT: No sore throat. Cardiovascular: Denies chest pain. Respiratory: Denies shortness of breath. Gastrointestinal: No abdominal pain.  No nausea, no vomiting.  No diarrhea.  No constipation. Genitourinary: Negative for dysuria. Musculoskeletal: Negative for back pain. Skin: Negative for rash. Neurological: Negative for headaches, focal weakness or numbness.   ____________________________________________   PHYSICAL EXAM:  VITAL SIGNS: ED Triage Vitals  Enc Vitals Group     BP 09/30/16 0802 118/63     Pulse Rate 09/30/16 0802 77     Resp 09/30/16 0802 16     Temp 09/30/16 0802 98.8 F (37.1 C)     Temp Source 09/30/16 0802 Oral     SpO2 09/30/16 0802 99 %     Weight 09/30/16 0803 243 lb (110.2 kg)     Height 09/30/16 0803 5\' 7"  (1.702 m)     Head Circumference --      Peak Flow --      Pain Score 09/30/16 0811 8     Pain Loc --      Pain Edu? --      Excl. in GC? --  Constitutional: Alert and oriented. Well appearing and in no acute distress. Eyes: Conjunctivae are normal. PERRL. EOMI. Head: Atraumatic. Nose: No congestion/rhinnorhea. Mouth/Throat: Mucous membranes are moist.  Oropharynx non-erythematous. Neck: No stridor.   Cardiovascular: Normal rate, regular rhythm. Grossly normal heart sounds.  Good peripheral circulation. Respiratory: Normal respiratory effort.  No retractions. Lungs CTAB. Gastrointestinal: Soft Patient has some tenderness on palpation right upper quadrant and suprapubically.. No distention. No abdominal bruits. No CVA tenderness. Rectal: No hemorrhoids are seen. No hemorrhoids are felt. There is very  little and in fact almost no stool in the rectal vault but what is there is Hemoccult-positive. Musculoskeletal: No lower extremity tenderness nor edema.  No joint effusions. Neurologic:  Normal speech and language. No gross focal neurologic deficits are appreciated. No gait instability. Skin:  Skin is warm, dry and intact. No rash noted. Psychiatric: Mood and affect are normal. Speech and behavior are normal.  ____________________________________________   LABS (all labs ordered are listed, but only abnormal results are displayed)  Labs Reviewed  COMPREHENSIVE METABOLIC PANEL - Abnormal; Notable for the following:       Result Value   Total Bilirubin 0.1 (*)    All other components within normal limits  CBC - Abnormal; Notable for the following:    MCV 77.3 (*)    RDW 15.8 (*)    All other components within normal limits  URINALYSIS, COMPLETE (UACMP) WITH MICROSCOPIC - Abnormal; Notable for the following:    Color, Urine YELLOW (*)    APPearance CLOUDY (*)    Specific Gravity, Urine 1.032 (*)    Squamous Epithelial / LPF 6-30 (*)    All other components within normal limits  LIPASE, BLOOD  TYPE AND SCREEN   ____________________________________________  EKG   ____________________________________________  RADIOLOGY  Ct Abdomen Pelvis W Contrast  Result Date: 09/30/2016 CLINICAL DATA:  Lower abdominal pain. Blood in stool. Right upper quadrant abdominal pain for several months. Recent nausea and vomiting. Suprapubic pain. EXAM: CT ABDOMEN AND PELVIS WITH CONTRAST TECHNIQUE: Multidetector CT imaging of the abdomen and pelvis was performed using the standard protocol following bolus administration of intravenous contrast. CONTRAST:  100 cc Isovue 370 COMPARISON:  None. FINDINGS: Lower chest: Unremarkable Hepatobiliary: Unremarkable Pancreas: Unremarkable Spleen: The spleen measures 12.7 by 11.6 by 5.6 cm (volume = 430 cm^3). 7 by 5 mm hypodense lesion in the upper spleen on image  20/ 2 is technically nonspecific, although statistically likely to be benign. Adrenals/Urinary Tract: 3 mm left kidney lower pole nonobstructive renal calculus. Adrenal glands normal. Stomach/Bowel: Appendix unremarkable. The density along the inferior margin of the cecum is felt to represent the right ovary. No well-defined CT abnormality of the gastrointestinal tract. Vascular/Lymphatic: Unremarkable Reproductive: Uterus absent.  Ovaries unremarkable. Other: No supplemental non-categorized findings. Musculoskeletal: 1.9 by 1.0 cm sharply defined sclerotic lesion in the superior pubic ramus on the left. Lower thoracic spondylosis. IMPRESSION: 1. A cause for the patient's anorectal bleeding is not apparent. 2. Borderline splenomegaly. Sub-centimeter hypodense lesion in the upper spleen is technically nonspecific although statistically likely to be benign. 3. 3 mm left kidney lower pole nonobstructive renal calculus. 4. 1.9 by 1.0 cm sharply defined sclerotic lesion in the left superior pubic ramus, highly likely to be a bone island given the lack of any other bony lesions. Electronically Signed   By: Gaylyn Rong M.D.   On: 09/30/2016 12:59    ____________________________________________   PROCEDURES  Procedure(s) performed:  Procedures  Critical Care performed:   ____________________________________________  INITIAL IMPRESSION / ASSESSMENT AND PLAN / ED COURSE  Pertinent labs & imaging results that were available during my care of the patient were reviewed by me and considered in my medical decision making (see chart for details).  Patient's blood count has decreased somewhat since her last H&H was done it was 13 and 37 and now is 12 and 35   discussed patient with Dr. Christain SacramentoBeaver the chest neurologist on call. She felt the patient could go either way inpatient or outpatient depending on how the patient fell. Discussed with patient and she has not had any further episodes of bleeding she  had her husband wished to go home and they will return immediately if there is any further heavy bleeding or lightheadedness etc. they will follow up with Dr. Eunice Blaseebbie [facility] the gastroenterologist this coming week.   ____________________________________________   FINAL CLINICAL IMPRESSION(S) / ED DIAGNOSES  Final diagnoses:  Abdominal pain, unspecified abdominal location  Rectal bleeding      NEW MEDICATIONS STARTED DURING THIS VISIT:  Discharge Medication List as of 09/30/2016  2:11 PM       Note:  This document was prepared using Dragon voice recognition software and may include unintentional dictation errors.    Arnaldo NatalMalinda, Paul F, MD 09/30/16 [phone]

## 2016-09-30 NOTE — Discharge Instructions (Signed)
Please return for heavy bleeding weakness or faintness fever vomiting or feeling worse. Or severe abdominal pain. Call Dr. Servando SnareWohl in the morning to schedule an appointment. Tell his office that you were seen in the emergency room for rectal bleeding. They should be to see her next couple days.

## 2016-09-30 NOTE — ED Notes (Signed)

## 2016-09-30 NOTE — ED Notes (Signed)
Assisted EDP Malinda with rectal exam.

## 2016-11-09 ENCOUNTER — Other Ambulatory Visit
Admission: RE | Admit: 2016-11-09 | Discharge: 2016-11-09 | Disposition: A | Discharge status: Home / Self Care | Payer: BLUE CROSS/BLUE SHIELD | Source: Ambulatory Visit | Attending: Internal Medicine | Admitting: Internal Medicine

## 2016-11-09 DIAGNOSIS — R1011 Right upper quadrant pain: Secondary | ICD-10-CM | POA: Diagnosis present

## 2016-11-09 LAB — FIBRIN DERIVATIVES D-DIMER (ARMC ONLY): Fibrin derivatives D-dimer (ARMC): 241.85 (ref 0.00–499.00)

## 2016-12-11 ENCOUNTER — Emergency Department
Admission: EM | Admit: 2016-12-11 | Discharge: 2016-12-11 | Disposition: A | Discharge status: Home / Self Care | Payer: BLUE CROSS/BLUE SHIELD | Attending: Emergency Medicine | Admitting: Emergency Medicine

## 2016-12-11 DIAGNOSIS — G8929 Other chronic pain: Secondary | ICD-10-CM | POA: Insufficient documentation

## 2016-12-11 DIAGNOSIS — F329 Major depressive disorder, single episode, unspecified: Secondary | ICD-10-CM | POA: Insufficient documentation

## 2016-12-11 DIAGNOSIS — Z79899 Other long term (current) drug therapy: Secondary | ICD-10-CM | POA: Diagnosis not present

## 2016-12-11 DIAGNOSIS — E119 Type 2 diabetes mellitus without complications: Secondary | ICD-10-CM | POA: Diagnosis not present

## 2016-12-11 DIAGNOSIS — Z7984 Long term (current) use of oral hypoglycemic drugs: Secondary | ICD-10-CM | POA: Diagnosis not present

## 2016-12-11 DIAGNOSIS — I10 Essential (primary) hypertension: Secondary | ICD-10-CM | POA: Insufficient documentation

## 2016-12-11 DIAGNOSIS — R1011 Right upper quadrant pain: Secondary | ICD-10-CM | POA: Diagnosis not present

## 2016-12-11 DIAGNOSIS — F419 Anxiety disorder, unspecified: Secondary | ICD-10-CM | POA: Diagnosis not present

## 2016-12-11 LAB — URINALYSIS, COMPLETE (UACMP) WITH MICROSCOPIC
Bilirubin Urine: NEGATIVE
Glucose, UA: NEGATIVE mg/dL
Hgb urine dipstick: NEGATIVE
KETONES UR: NEGATIVE mg/dL
Leukocytes, UA: NEGATIVE
Nitrite: NEGATIVE
PROTEIN: 30 mg/dL — AB
RBC / HPF: NONE SEEN RBC/hpf (ref 0–5)
Specific Gravity, Urine: 1.025 (ref 1.005–1.030)
pH: 5 (ref 5.0–8.0)

## 2016-12-11 LAB — CBC
HEMATOCRIT: 36.9 % (ref 35.0–47.0)
HEMOGLOBIN: 12.6 g/dL (ref 12.0–16.0)
MCH: 26.6 pg (ref 26.0–34.0)
MCHC: 34.1 g/dL (ref 32.0–36.0)
MCV: 77.8 fL — ABNORMAL LOW (ref 80.0–100.0)
Platelets: 227 10*3/uL (ref 150–440)
RBC: 4.75 MIL/uL (ref 3.80–5.20)
RDW: 15.8 % — ABNORMAL HIGH (ref 11.5–14.5)
WBC: 9.5 10*3/uL (ref 3.6–11.0)

## 2016-12-11 LAB — COMPREHENSIVE METABOLIC PANEL
ALBUMIN: 3.6 g/dL (ref 3.5–5.0)
ALK PHOS: 74 U/L (ref 38–126)
ALT: 23 U/L (ref 14–54)
ANION GAP: 8 (ref 5–15)
AST: 22 U/L (ref 15–41)
BUN: 13 mg/dL (ref 6–20)
CO2: 29 mmol/L (ref 22–32)
Calcium: 9 mg/dL (ref 8.9–10.3)
Chloride: 102 mmol/L (ref 101–111)
Creatinine, Ser: 0.42 mg/dL — ABNORMAL LOW (ref 0.44–1.00)
GFR calc Af Amer: >60 mL/min (ref >60)
GFR calc non Af Amer: >60 mL/min (ref >60)
GLUCOSE: 124 mg/dL — AB (ref 65–99)
POTASSIUM: 3.3 mmol/L — AB (ref 3.5–5.1)
SODIUM: 139 mmol/L (ref 135–145)
Total Bilirubin: 0.4 mg/dL (ref 0.3–1.2)
Total Protein: 7.7 g/dL (ref 6.5–8.1)

## 2016-12-11 LAB — LIPASE, BLOOD: Lipase: 22 U/L (ref 11–51)

## 2016-12-11 MED ORDER — METOCLOPRAMIDE HCL 10 MG PO TABS: 10.0000 mg | ORAL_TABLET | Freq: Four times a day (QID) | ORAL | 0 refills | Status: AC | PRN

## 2016-12-11 MED ORDER — FAMOTIDINE 20 MG PO TABS
ORAL_TABLET | ORAL | Status: AC
  Filled 2016-12-11: qty 2

## 2016-12-11 MED ORDER — FAMOTIDINE 20 MG PO TABS: 20.0000 mg | ORAL_TABLET | Freq: Two times a day (BID) | ORAL | 0 refills | Status: DC

## 2016-12-11 MED ORDER — GI COCKTAIL ~~LOC~~
30.0000 mL | ORAL | Status: AC
  Administered 2016-12-11: 30 mL via ORAL

## 2016-12-11 MED ORDER — METOCLOPRAMIDE HCL 10 MG PO TABS
10.0000 mg | ORAL_TABLET | Freq: Once | ORAL | Status: AC
Start: 2016-12-11 — End: 2016-12-11
  Administered 2016-12-11: 10 mg via ORAL

## 2016-12-11 MED ORDER — METOCLOPRAMIDE HCL 10 MG PO TABS
ORAL_TABLET | ORAL | Status: AC
  Filled 2016-12-11: qty 1

## 2016-12-11 MED ORDER — SUCRALFATE 1 G PO TABS: 1.0000 g | ORAL_TABLET | Freq: Four times a day (QID) | ORAL | 1 refills | Status: AC

## 2016-12-11 MED ORDER — GI COCKTAIL ~~LOC~~
ORAL | Status: AC
  Filled 2016-12-11: qty 30

## 2016-12-11 MED ORDER — FAMOTIDINE 20 MG PO TABS
40.0000 mg | ORAL_TABLET | Freq: Once | ORAL | Status: AC
Start: 2016-12-11 — End: 2016-12-11
  Administered 2016-12-11: 40 mg via ORAL

## 2016-12-11 NOTE — ED Notes (Signed)
Pt saw [facility] 3 weeks ago and pt was referred to GI for "testing on her gallbladder at [facility]" - pt has right upper quad pain for the last year that is daily

## 2016-12-11 NOTE — ED Triage Notes (Signed)
Pt has right upper quad pain for over 1 year.  Pt reports she has tests at [facility] for gallbladder problems this Friday.  Pt has increased pain and nausea.  Pt alert

## 2016-12-11 NOTE — ED Provider Notes (Signed)
[facility] Emergency Department Provider Note  ____________________________________________  Time seen: Approximately 10:00 PM  I have reviewed the triage vital signs and the nursing notes.   HISTORY  Chief Complaint Abdominal Pain and Headache    HPI Jasmine Duncan is a 41 y.o. female who complains of recurrence of chronic right upper quadrant abdominal pain that is been bothering her over the past year. She's had multiple evaluations, labs and ultrasounds that have been unremarkable so far.she is scheduled for a HIDA scan had [facility] to her 3 days from now. She is also scheduled for EGD and colonoscopy in about 2 weeks. Pain is intermittent, no aggravating or alleviating factors, aching. Nonradiating.     Past Medical History:  Diagnosis Date  . Anxiety   . Depression   . Diabetes mellitus without complication (HCC)   . GERD (gastroesophageal reflux disease)   . Hypertension      There are no active problems to display for this patient.    Past Surgical History:  Procedure Laterality Date  . ABDOMINAL HYSTERECTOMY    . CERVIX REMOVAL       Prior to Admission medications   Medication Sig Start Date End Date Taking? Authorizing Provider  busPIRone (BUSPAR) 15 MG tablet Take 15 mg by mouth 2 (two) times daily. 07/24/16   [provider]  escitalopram (LEXAPRO) 20 MG tablet Take 20 mg by mouth daily. 08/25/16   [provider]  famotidine (PEPCID) 20 MG tablet Take 1 tablet (20 mg total) by mouth 2 (two) times daily. 12/11/16   Sharman Cheek, MD  hydrochlorothiazide (HYDRODIURIL) 25 MG tablet Take 25 mg by mouth daily. 08/25/16   [provider]  ibuprofen (ADVIL,MOTRIN) 800 MG tablet Take 1 tablet (800 mg total) by mouth every 8 (eight) hours as needed for moderate pain. 06/08/16   Irean Hong, MD  metFORMIN (GLUCOPHAGE-XR) 500 MG 24 hr tablet Take 500 mg by mouth daily. 08/25/16   [provider]   metoCLOPramide (REGLAN) 10 MG tablet Take 1 tablet (10 mg total) by mouth every 6 (six) hours as needed. 12/11/16   Sharman Cheek, MD  sucralfate (CARAFATE) 1 g tablet Take 1 tablet (1 g total) by mouth 4 (four) times daily. 12/11/16   Sharman Cheek, MD     Allergies Patient has no known allergies.   No family history on file.  Social History Social History  Substance Use Topics  . Smoking status: Never Smoker  . Smokeless tobacco: Never Used  . Alcohol use No    Review of Systems  Constitutional:   No fever or chills.  ENT:   No sore throat. No rhinorrhea. Cardiovascular:   No chest pain or syncope. Respiratory:   No dyspnea or cough. Gastrointestinal:   positive as above for abdominal pain without vomiting and diarrhea.  Musculoskeletal:   Negative for focal pain or swelling All other systems reviewed and are negative except as documented above in ROS and HPI.  ____________________________________________   PHYSICAL EXAM:  VITAL SIGNS: ED Triage Vitals  Enc Vitals Group     BP 12/11/16 1927 135/78     Pulse Rate 12/11/16 1927 81     Resp 12/11/16 1927 18     Temp 12/11/16 1927 97.8 F (36.6 C)     Temp Source 12/11/16 1927 Oral     SpO2 12/11/16 1927 100 %     Weight 12/11/16 1927 254 lb (115.2 kg)     Height 12/11/16 1927   (1.676 m)     Head Circumference --      Peak Flow --      Pain Score 12/11/16 1931 10     Pain Loc --      Pain Edu? --      Excl. in GC? --     Vital signs reviewed, nursing assessments reviewed.   Constitutional:   Alert and oriented. Well appearing and in no distress. Eyes:   No scleral icterus.  EOMI. No nystagmus. No conjunctival pallor. PERRL. ENT   Head:   Normocephalic and atraumatic.   Nose:   No congestion/rhinnorhea.    Mouth/Throat:   MMM, no pharyngeal erythema. No peritonsillar mass.    Neck:   No meningismus. Full ROM Hematological/Lymphatic/Immunilogical:   No cervical  lymphadenopathy. Cardiovascular:   RRR. Symmetric bilateral radial and DP pulses.  No murmurs.  Respiratory:   Normal respiratory effort without tachypnea/retractions. Breath sounds are clear and equal bilaterally. No wheezes/rales/rhonchi. Gastrointestinal:   Soft and nontender. Non distended. There is no CVA tenderness.  No rebound, rigidity, or guarding. Genitourinary:   deferred Musculoskeletal:   Normal range of motion in all extremities. No joint effusions.  No lower extremity tenderness.  No edema. Neurologic:   Normal speech and language.  Motor grossly intact. No gross focal neurologic deficits are appreciated.  Skin:    Skin is warm, dry and intact. No rash noted.  No petechiae, purpura, or bullae.  ____________________________________________    LABS (pertinent positives/negatives) (all labs ordered are listed, but only abnormal results are displayed) Labs Reviewed  COMPREHENSIVE METABOLIC PANEL - Abnormal; Notable for the following:       Result Value   Potassium 3.3 (*)    Glucose, Bld 124 (*)    Creatinine, Ser 0.42 (*)    All other components within normal limits  CBC - Abnormal; Notable for the following:    MCV 77.8 (*)    RDW 15.8 (*)    All other components within normal limits  URINALYSIS, COMPLETE (UACMP) WITH MICROSCOPIC - Abnormal; Notable for the following:    Color, Urine YELLOW (*)    APPearance CLEAR (*)    Protein, ur 30 (*)    Bacteria, UA RARE (*)    Squamous Epithelial / LPF 6-30 (*)    All other components within normal limits  LIPASE, BLOOD   ____________________________________________   EKG    ____________________________________________    RADIOLOGY  No results found.  ____________________________________________   PROCEDURES Procedures  ____________________________________________   INITIAL IMPRESSION / ASSESSMENT AND PLAN / ED COURSE  Pertinent labs & imaging results that were available during my care of the patient  were reviewed by me and considered in my medical decision making (see chart for details).  patient well appearing no acute distress, presents with recurrent chronic abdominal pain. Vital signs, labs, and exam are all benign and reassuring. She has great follow-up in place with further advanced testing that is not available in the emergency department. She is not in distress. We'll treat her with antacid therapy in case her symptoms are related to dyspepsia and peptic ulcer disease. She is tolerating oral intake and suitable for discharge home and outpatient follow-up as scheduled.Considering the patient's symptoms, medical history, and physical examination today, I have low suspicion for cholecystitis or biliary pathology, pancreatitis, perforation or bowel obstruction, hernia, intra-abdominal abscess, AAA or dissection, volvulus or intussusception, mesenteric ischemia, or appendicitis.        ____________________________________________   FINAL  CLINICAL IMPRESSION(S) / ED DIAGNOSES  Final diagnoses:  Abdominal pain, chronic, right upper quadrant      New Prescriptions   FAMOTIDINE (PEPCID) 20 MG TABLET    Take 1 tablet (20 mg total) by mouth 2 (two) times daily.   METOCLOPRAMIDE (REGLAN) 10 MG TABLET    Take 1 tablet (10 mg total) by mouth every 6 (six) hours as needed.   SUCRALFATE (CARAFATE) 1 G TABLET    Take 1 tablet (1 g total) by mouth 4 (four) times daily.     Portions of this note were generated with dragon dictation software. Dictation errors may occur despite best attempts at proofreading.    Sharman Cheek, MD 12/11/16 2209

## 2016-12-11 NOTE — ED Notes (Signed)
Pt tolerated Ginger ale

## 2016-12-11 NOTE — ED Triage Notes (Signed)
Patient ambulatory to stat desk without difficulty with c/o right sided abd pain, nausea, and "migraine" headache.

## 2017-08-09 ENCOUNTER — Other Ambulatory Visit: Payer: Self-pay | Admitting: Family Medicine

## 2017-08-09 DIAGNOSIS — R1011 Right upper quadrant pain: Secondary | ICD-10-CM

## 2017-08-09 DIAGNOSIS — F32A Depression, unspecified: Secondary | ICD-10-CM

## 2017-08-09 DIAGNOSIS — F329 Major depressive disorder, single episode, unspecified: Secondary | ICD-10-CM

## 2017-08-11 ENCOUNTER — Emergency Department
Admission: EM | Admit: 2017-08-11 | Discharge: 2017-08-11 | Disposition: A | Discharge status: Home / Self Care | Payer: BLUE CROSS/BLUE SHIELD | Attending: Emergency Medicine | Admitting: Emergency Medicine

## 2017-08-11 ENCOUNTER — Emergency Department: Payer: BLUE CROSS/BLUE SHIELD

## 2017-08-11 ENCOUNTER — Other Ambulatory Visit: Payer: Self-pay

## 2017-08-11 DIAGNOSIS — K760 Fatty (change of) liver, not elsewhere classified: Secondary | ICD-10-CM

## 2017-08-11 DIAGNOSIS — R112 Nausea with vomiting, unspecified: Secondary | ICD-10-CM | POA: Diagnosis not present

## 2017-08-11 DIAGNOSIS — Z79899 Other long term (current) drug therapy: Secondary | ICD-10-CM | POA: Insufficient documentation

## 2017-08-11 DIAGNOSIS — Z7984 Long term (current) use of oral hypoglycemic drugs: Secondary | ICD-10-CM | POA: Diagnosis not present

## 2017-08-11 DIAGNOSIS — E119 Type 2 diabetes mellitus without complications: Secondary | ICD-10-CM | POA: Diagnosis not present

## 2017-08-11 DIAGNOSIS — R1011 Right upper quadrant pain: Secondary | ICD-10-CM

## 2017-08-11 DIAGNOSIS — I10 Essential (primary) hypertension: Secondary | ICD-10-CM | POA: Diagnosis not present

## 2017-08-11 LAB — CBC
HEMATOCRIT: 36.6 % (ref 35.0–47.0)
HEMOGLOBIN: 12.5 g/dL (ref 12.0–16.0)
MCH: 26.7 pg (ref 26.0–34.0)
MCHC: 34.2 g/dL (ref 32.0–36.0)
MCV: 78.1 fL — AB (ref 80.0–100.0)
Platelets: 242 10*3/uL (ref 150–440)
RBC: 4.68 MIL/uL (ref 3.80–5.20)
RDW: 16 % — ABNORMAL HIGH (ref 11.5–14.5)
WBC: 9.8 10*3/uL (ref 3.6–11.0)

## 2017-08-11 LAB — COMPREHENSIVE METABOLIC PANEL
ALT: 30 U/L (ref 14–54)
AST: 29 U/L (ref 15–41)
Albumin: 3.7 g/dL (ref 3.5–5.0)
Alkaline Phosphatase: 82 U/L (ref 38–126)
Anion gap: 10 (ref 5–15)
BUN: 14 mg/dL (ref 6–20)
CHLORIDE: 99 mmol/L — AB (ref 101–111)
CO2: 27 mmol/L (ref 22–32)
Calcium: 9.4 mg/dL (ref 8.9–10.3)
Creatinine, Ser: 0.54 mg/dL (ref 0.44–1.00)
Glucose, Bld: 153 mg/dL — ABNORMAL HIGH (ref 65–99)
POTASSIUM: 3.6 mmol/L (ref 3.5–5.1)
Sodium: 136 mmol/L (ref 135–145)
Total Bilirubin: 0.5 mg/dL (ref 0.3–1.2)
Total Protein: 7.7 g/dL (ref 6.5–8.1)

## 2017-08-11 LAB — URINALYSIS, COMPLETE (UACMP) WITH MICROSCOPIC
BACTERIA UA: NONE SEEN
Bilirubin Urine: NEGATIVE
Glucose, UA: >=500 mg/dL — AB
Hgb urine dipstick: NEGATIVE
Ketones, ur: NEGATIVE mg/dL
Leukocytes, UA: NEGATIVE
Nitrite: NEGATIVE
PROTEIN: NEGATIVE mg/dL
Specific Gravity, Urine: 1.013 (ref 1.005–1.030)
pH: 7 (ref 5.0–8.0)

## 2017-08-11 LAB — LIPASE, BLOOD: LIPASE: 27 U/L (ref 11–51)

## 2017-08-11 LAB — POCT PREGNANCY, URINE: PREG TEST UR: NEGATIVE

## 2017-08-11 MED ORDER — ONDANSETRON 4 MG PO TBDP
4.0000 mg | ORAL_TABLET | Freq: Once | ORAL | Status: AC
  Administered 2017-08-11: 4 mg via ORAL
  Filled 2017-08-11: qty 1

## 2017-08-11 MED ORDER — MORPHINE SULFATE (PF) 4 MG/ML IV SOLN
4.0000 mg | Freq: Once | INTRAVENOUS | Status: AC
  Administered 2017-08-11: 4 mg via INTRAMUSCULAR
  Filled 2017-08-11: qty 1

## 2017-08-11 NOTE — ED Triage Notes (Signed)
Pt c/o RUQ pain for the past month, worse in the past week with nausea. States she was seen by her PCP last week and he scheduled for US but the pain got to bad.

## 2017-08-11 NOTE — ED Provider Notes (Signed)
[facility] Emergency Department Provider Note  Time seen: 2:35 PM  I have reviewed the triage vital signs and the nursing notes.   HISTORY  Chief Complaint Abdominal Pain    HPI Jasmine Duncan is a 42 y.o. female with a past medical history of gastric reflux, depression, hypertension, presents to the emergency department for right upper quadrant pain.  According to the patient for the past 2 months she has been experiencing fairly constant right upper quadrant pain, has been worse over the past 2 to 3 weeks.  Patient saw her PCP who scheduled her for a right upper quadrant ultrasound but it does not occur until Wednesday.  Patient states the pain became more severe today so she came to the emergency department for evaluation.  States nausea has had 1 or 2 episodes of vomiting over the past month.  Denies any diarrhea.  States the pain is worse after she eats.  Denies any alcohol use.  Denies any fever.  Patient has had a hysterectomy and C-sections as her only abdominal surgeries in the past.  Currently describes her pain as moderate aching pain in the right upper quadrant.   Past Medical History:  Diagnosis Date  . Anxiety   . Depression   . Diabetes mellitus without complication (HCC)   . GERD (gastroesophageal reflux disease)   . Hypertension     There are no active problems to display for this patient.   Past Surgical History:  Procedure Laterality Date  . ABDOMINAL HYSTERECTOMY    . CERVIX REMOVAL      Prior to Admission medications   Medication Sig Start Date End Date Taking? Authorizing Provider  busPIRone (BUSPAR) 15 MG tablet Take 15 mg by mouth 2 (two) times daily. 07/24/16   [provider]  escitalopram (LEXAPRO) 20 MG tablet Take 20 mg by mouth daily. 08/25/16   [provider]  famotidine (PEPCID) 20 MG tablet Take 1 tablet (20 mg total) by mouth 2 (two) times daily. 12/11/16   Sharman Cheek, MD  hydrochlorothiazide  (HYDRODIURIL) 25 MG tablet Take 25 mg by mouth daily. 08/25/16   [provider]  ibuprofen (ADVIL,MOTRIN) 800 MG tablet Take 1 tablet (800 mg total) by mouth every 8 (eight) hours as needed for moderate pain. 06/08/16   Irean Hong, MD  metFORMIN (GLUCOPHAGE-XR) 500 MG 24 hr tablet Take 500 mg by mouth daily. 08/25/16   [provider]  metoCLOPramide (REGLAN) 10 MG tablet Take 1 tablet (10 mg total) by mouth every 6 (six) hours as needed. 12/11/16   Sharman Cheek, MD  sucralfate (CARAFATE) 1 g tablet Take 1 tablet (1 g total) by mouth 4 (four) times daily. 12/11/16   Sharman Cheek, MD    No Known Allergies  No family history on file.  Social History Social History   Tobacco Use  . Smoking status: Never Smoker  . Smokeless tobacco: Never Used  Substance Use Topics  . Alcohol use: No  . Drug use: No    Review of Systems Constitutional: Negative for fever. Eyes: Negative for visual complaints ENT: Negative for recent illness/congestion Cardiovascular: Negative for chest pain. Respiratory: Negative for shortness of breath. Gastrointestinal: Right upper quadrant pain.  Positive for nausea.  One episode of vomiting.  No diarrhea. Genitourinary: Negative for urinary compaints Musculoskeletal: Negative for musculoskeletal complaints Skin: Negative for skin complaints  Neurological: Negative for headache All other ROS negative  ____________________________________________   PHYSICAL EXAM:  VITAL SIGNS: ED Triage  Vitals  Enc Vitals Group     BP 08/11/17 1315 (!) 157/101     Pulse Rate 08/11/17 1315 (!) 104     Resp 08/11/17 1315 14     Temp 08/11/17 1315 98.5 F (36.9 C)     Temp Source 08/11/17 1315 Oral     SpO2 08/11/17 1315 99 %     Weight 08/11/17 1315 271 lb (122.9 kg)     Height 08/11/17 1315 5\' 7"  (1.702 m)     Head Circumference --      Peak Flow --      Pain Score 08/11/17 1327 10     Pain Loc --      Pain Edu? --      Excl. in GC? --      Constitutional: Alert and oriented.  Mild distress due to pain. Eyes: Normal exam ENT   Head: Normocephalic and atraumatic.   Mouth/Throat: Mucous membranes are moist. Cardiovascular: Normal rate, regular rhythm. No murmur Respiratory: Normal respiratory effort without tachypnea nor retractions. Breath sounds are clear Gastrointestinal: Soft, moderate right upper quadrant tenderness, no rebound or guarding.  No distention.  Abdomen otherwise benign. Musculoskeletal: Nontender with normal range of motion in all extremities.  Neurologic:  Normal speech and language. No gross focal neurologic deficits Skin:  Skin is warm, dry and intact.  Psychiatric: Mood and affect are normal.   ____________________________________________     RADIOLOGY  Ultrasound consistent with hepatic steatosis.  ____________________________________________   INITIAL IMPRESSION / ASSESSMENT AND PLAN / ED COURSE  Pertinent labs & imaging results that were available during my care of the patient were reviewed by me and considered in my medical decision making (see chart for details).  Patient presents to the emergency department for right upper quadrant abdominal pain ongoing for 2 months worse over the past several weeks.  Differential would include cholecystitis, biliary colic, pancreatitis, hepatic steatosis, other intra-abdominal pathology.  We will check labs, right upper quadrant ultrasound.  We will treat pain and nausea and continue to closely monitor.  Overall the patient does appear well does appear to be in mild discomfort however.  Patient's labs have resulted largely within normal limits including normal LFTs, normal white blood cell count and a normal lipase.  Patient does have glucose within her urine, but a known history of diabetes.  Right upper quadrant ultrasound pending.  Ultrasound consistent with hepatic steatosis.  I discussed low-fat diet as well as weight loss.  I also discussed  PCP follow-up.  Patient agreeable to this plan of care.  Pain has been reduced after pain medication.  ____________________________________________   FINAL CLINICAL IMPRESSION(S) / ED DIAGNOSES  Right upper quadrant pain    Minna AntisPaduchowski, Joud Ingwersen, MD 08/11/17 [phone]

## 2017-08-11 NOTE — ED Notes (Signed)
Pt report RUQ pain x 2-3 months, but states that the pain has gotten more intense in the past week.  Pt states that she was supposed to go to get an US on Wednesday, but that she can't wait due to pain.  Pt reports increased pain with eating.  Pt also reports that she has had a decreased appetite due to the pain as well.  Pt ambulatory from triage, A&Ox4, in NAD.

## 2018-08-31 ENCOUNTER — Other Ambulatory Visit: Payer: Self-pay

## 2018-08-31 ENCOUNTER — Encounter: Payer: Self-pay | Admitting: Emergency Medicine

## 2018-08-31 ENCOUNTER — Emergency Department
Admission: EM | Admit: 2018-08-31 | Discharge: 2018-08-31 | Disposition: A | Discharge status: Home / Self Care | Payer: BC Managed Care – PPO | Attending: Emergency Medicine | Admitting: Emergency Medicine

## 2018-08-31 ENCOUNTER — Emergency Department: Payer: BC Managed Care – PPO

## 2018-08-31 DIAGNOSIS — G8929 Other chronic pain: Secondary | ICD-10-CM | POA: Diagnosis not present

## 2018-08-31 DIAGNOSIS — R1011 Right upper quadrant pain: Secondary | ICD-10-CM | POA: Insufficient documentation

## 2018-08-31 DIAGNOSIS — Z79899 Other long term (current) drug therapy: Secondary | ICD-10-CM | POA: Insufficient documentation

## 2018-08-31 DIAGNOSIS — E119 Type 2 diabetes mellitus without complications: Secondary | ICD-10-CM | POA: Insufficient documentation

## 2018-08-31 DIAGNOSIS — Z7984 Long term (current) use of oral hypoglycemic drugs: Secondary | ICD-10-CM | POA: Diagnosis not present

## 2018-08-31 DIAGNOSIS — I10 Essential (primary) hypertension: Secondary | ICD-10-CM | POA: Diagnosis not present

## 2018-08-31 HISTORY — DX: Other chronic pain: G89.29

## 2018-08-31 HISTORY — DX: Morbid (severe) obesity due to excess calories: E66.01

## 2018-08-31 LAB — URINALYSIS, COMPLETE (UACMP) WITH MICROSCOPIC
Bacteria, UA: NONE SEEN
Bilirubin Urine: NEGATIVE
Glucose, UA: NEGATIVE mg/dL
Hgb urine dipstick: NEGATIVE
Ketones, ur: NEGATIVE mg/dL
Leukocytes,Ua: NEGATIVE
Nitrite: NEGATIVE
Protein, ur: NEGATIVE mg/dL
Specific Gravity, Urine: 1.017 (ref 1.005–1.030)
WBC, UA: NONE SEEN WBC/hpf (ref 0–5)
pH: 6 (ref 5.0–8.0)

## 2018-08-31 LAB — COMPREHENSIVE METABOLIC PANEL
ALT: 19 U/L (ref 0–44)
AST: 15 U/L (ref 15–41)
Albumin: 3.7 g/dL (ref 3.5–5.0)
Alkaline Phosphatase: 82 U/L (ref 38–126)
Anion gap: 10 (ref 5–15)
BUN: 15 mg/dL (ref 6–20)
CO2: 27 mmol/L (ref 22–32)
Calcium: 8.9 mg/dL (ref 8.9–10.3)
Chloride: 99 mmol/L (ref 98–111)
Creatinine, Ser: 0.55 mg/dL (ref 0.44–1.00)
GFR calc Af Amer: >60 mL/min (ref >60)
GFR calc non Af Amer: >60 mL/min (ref >60)
Glucose, Bld: 102 mg/dL — ABNORMAL HIGH (ref 70–99)
Potassium: 3.6 mmol/L (ref 3.5–5.1)
Sodium: 136 mmol/L (ref 135–145)
Total Bilirubin: 0.2 mg/dL — ABNORMAL LOW (ref 0.3–1.2)
Total Protein: 7.4 g/dL (ref 6.5–8.1)

## 2018-08-31 LAB — LIPASE, BLOOD: Lipase: 30 U/L (ref 11–51)

## 2018-08-31 LAB — CBC
HCT: 36.5 % (ref 36.0–46.0)
Hemoglobin: 11.9 g/dL — ABNORMAL LOW (ref 12.0–15.0)
MCH: 25.9 pg — ABNORMAL LOW (ref 26.0–34.0)
MCHC: 32.6 g/dL (ref 30.0–36.0)
MCV: 79.5 fL — ABNORMAL LOW (ref 80.0–100.0)
Platelets: 264 10*3/uL (ref 150–400)
RBC: 4.59 MIL/uL (ref 3.87–5.11)
RDW: 15 % (ref 11.5–15.5)
WBC: 12.3 10*3/uL — ABNORMAL HIGH (ref 4.0–10.5)
nRBC: 0 % (ref 0.0–0.2)

## 2018-08-31 NOTE — Discharge Instructions (Signed)
Your workup in the Emergency Department today was reassuring.  We did not find any specific abnormalities.  We recommend you drink plenty of fluids, take your regular medications and/or any new ones prescribed today, and follow up with the doctor(s) listed in these documents as recommended.  Return to the Emergency Department if you develop new or worsening symptoms that concern you.  

## 2018-08-31 NOTE — ED Notes (Signed)
ED Provider at bedside. 

## 2018-08-31 NOTE — ED Notes (Signed)
Observed sitting in waiting room, no acute distress noted.

## 2018-08-31 NOTE — ED Triage Notes (Signed)
Patient reports having right upper quad "for awhile".  Patient also reports swelling all over.  Patient also reports can't feel the bottom of her foot "for a long time".

## 2018-08-31 NOTE — ED Provider Notes (Signed)
[facility] Emergency Department Provider Note  ____________________________________________   First MD Initiated Contact with Patient 08/31/18 0510     (approximate)  I have reviewed the triage vital signs and the nursing notes.   HISTORY  Chief Complaint Abdominal Pain    HPI Jasmine Duncan is a 43 y.o. female with medical history as listed below which notably includes chronic pain in the right upper quadrant with prior work-ups including ultrasounds, CT scans, and visits to the pain clinic.  She presents tonight for which she describes as worsening episodic pain in the upper part of her abdomen as well as underneath her right rib cage.  She says it is worse after she eats or drinks, sometimes accompanied with nausea and vomiting, and nothing in particular makes it better.  She says that she went to a GI doctor in [place] who sent her to a pain clinic and she used to have "some sort of nerve block" which she says once helped but now it does not make a difference.  The pain feels the same as it has in the past but feels worse and she describes it as both a pinching pain and sometimes a sharp pain that goes through to her back.  She denies fever, sore throat, chest pain, shortness of breath, cough, dysuria, lower abdominal pain, and she denies being in contact with anyone who is known to have COVID-19.        Past Medical History:  Diagnosis Date  . Abdominal pain, chronic, right upper quadrant    has seen GI and pain clinic, no definitive diagnosis  . Anxiety   . Depression   . Diabetes mellitus without complication (HCC)   . GERD (gastroesophageal reflux disease)   . Hypertension   . Morbid obesity (HCC)     There are no active problems to display for this patient.   Past Surgical History:  Procedure Laterality Date  . ABDOMINAL HYSTERECTOMY    . CERVIX REMOVAL      Prior to Admission medications   Medication Sig Start Date End Date Taking?  Authorizing Provider  busPIRone (BUSPAR) 15 MG tablet Take 15 mg by mouth 2 (two) times daily. 07/24/16   [provider]  escitalopram (LEXAPRO) 20 MG tablet Take 20 mg by mouth daily. 08/25/16   [provider]  famotidine (PEPCID) 20 MG tablet Take 1 tablet (20 mg total) by mouth 2 (two) times daily. 12/11/16   Sharman CheekStafford, Phillip, MD  hydrochlorothiazide (HYDRODIURIL) 25 MG tablet Take 25 mg by mouth daily. 08/25/16   [provider]  ibuprofen (ADVIL,MOTRIN) 800 MG tablet Take 1 tablet (800 mg total) by mouth every 8 (eight) hours as needed for moderate pain. 06/08/16   Irean HongSung, Jade J, MD  metFORMIN (GLUCOPHAGE-XR) 500 MG 24 hr tablet Take 500 mg by mouth daily. 08/25/16   [provider]  metoCLOPramide (REGLAN) 10 MG tablet Take 1 tablet (10 mg total) by mouth every 6 (six) hours as needed. 12/11/16   Sharman CheekStafford, Phillip, MD  sucralfate (CARAFATE) 1 g tablet Take 1 tablet (1 g total) by mouth 4 (four) times daily. 12/11/16   Sharman CheekStafford, Phillip, MD    Allergies Patient has no known allergies.  History reviewed. No pertinent family history.  Social History Social History   Tobacco Use  . Smoking status: Never Smoker  . Smokeless tobacco: Never Used  Substance Use Topics  . Alcohol use: No  . Drug use: No    Review  of Systems Constitutional: No fever/chills Eyes: No visual changes. ENT: No sore throat. Cardiovascular: Denies chest pain. Respiratory: Denies shortness of breath. Gastrointestinal: Gradually worsening episodic upper and right upper quadrant abdominal pain occasionally accompanied by nausea and vomiting as described above. Genitourinary: Negative for dysuria. Musculoskeletal: Negative for neck pain.  Negative for back pain. Integumentary: Negative for rash. Neurological: Negative for headaches, focal weakness or numbness.   ____________________________________________   PHYSICAL EXAM:  VITAL SIGNS: ED Triage Vitals  Enc Vitals Group      BP 08/31/18 0119 118/77     Pulse Rate 08/31/18 0119 86     Resp 08/31/18 0119 16     Temp 08/31/18 0119 98.2 F (36.8 C)     Temp Source 08/31/18 0119 Oral     SpO2 08/31/18 0119 99 %     Weight 08/31/18 0114 122.5 kg (270 lb)     Height 08/31/18 0114 1.676 m (5\' 6" )     Head Circumference --      Peak Flow --      Pain Score 08/31/18 0113 9     Pain Loc --      Pain Edu? --      Excl. in [place]? --     Constitutional: Alert and oriented. Well appearing and in no acute distress. Eyes: Conjunctivae are normal.  Head: Atraumatic. Nose: No congestion/rhinnorhea. Mouth/Throat: Mucous membranes are moist. Neck: No stridor.  No meningeal signs.   Cardiovascular: Normal rate, regular rhythm. Good peripheral circulation. Grossly normal heart sounds. Respiratory: Normal respiratory effort.  No retractions. No audible wheezing. Gastrointestinal: Obese.  Soft and nondistended.  Tenderness to palpation of the epigastrium and right upper quadrant with equivocal Murphy sign.  No lower abdominal tenderness. Musculoskeletal: No lower extremity tenderness nor edema. No gross deformities of extremities. Neurologic:  Normal speech and language. No gross focal neurologic deficits are appreciated.  Skin:  Skin is warm, dry and intact. No rash noted. Psychiatric: Mood and affect are normal. Speech and behavior are normal.  ____________________________________________   LABS (all labs ordered are listed, but only abnormal results are displayed)  Labs Reviewed  COMPREHENSIVE METABOLIC PANEL - Abnormal; Notable for the following components:      Result Value   Glucose, Bld 102 (*)    Total Bilirubin 0.2 (*)    All other components within normal limits  CBC - Abnormal; Notable for the following components:   WBC 12.3 (*)    Hemoglobin 11.9 (*)    MCV 79.5 (*)    MCH 25.9 (*)    All other components within normal limits  URINALYSIS, COMPLETE (UACMP) WITH MICROSCOPIC - Abnormal; Notable for the  following components:   Color, Urine YELLOW (*)    APPearance CLEAR (*)    All other components within normal limits  LIPASE, BLOOD   ____________________________________________  EKG  None - EKG not ordered by ED physician ____________________________________________  RADIOLOGY   ED MD interpretation:  No acute abnormalities identified.  Patient was already aware of her fatty liver disease.  Official radiology report(s): US Abdomen Limited Ruq  Result Date: 08/31/2018 CLINICAL DATA:  43 y/o F; right upper quadrant and epigastric abdominal pain with tenderness, nausea, and vomiting. EXAM: ULTRASOUND ABDOMEN LIMITED RIGHT UPPER QUADRANT COMPARISON:  08/11/2017 abdominal ultrasound. FINDINGS: Gallbladder: No gallstones or wall thickening visualized. No sonographic Murphy sign noted by sonographer. Common bile duct: Diameter: 4.2 mm Liver: No focal lesion identified. Diffusely increased liver echogenicity. Portal vein is patent on color Doppler  imaging with normal direction of blood flow towards the liver. IMPRESSION: Hepatic steatosis again noted. Otherwise unremarkable right upper quadrant ultrasound. Electronically Signed   By: Mitzi HansenLance  Furusawa-Stratton M.D.   On: 08/31/2018 06:23    ____________________________________________   PROCEDURES   Procedure(s) performed (including Critical Care):  Procedures   ____________________________________________   INITIAL IMPRESSION / MDM / ASSESSMENT AND PLAN / ED COURSE  As part of my medical decision making, I reviewed the following data within the electronic MEDICAL RECORD NUMBER Nursing notes reviewed and incorporated, Labs reviewed , Old chart reviewed, Notes from prior ED visits and [place] Controlled Substance Database      *Jasmine Duncan was evaluated in Emergency Department on 08/31/2018 for the symptoms described in the history of present illness. She was evaluated in the context of the global COVID-19 pandemic, which necessitated  consideration that the patient might be at risk for infection with the SARS-CoV-2 virus that causes COVID-19. Institutional protocols and algorithms that pertain to the evaluation of patients at risk for COVID-19 are in a state of rapid change based on information released by regulatory bodies including the CDC and federal and state organizations. These policies and algorithms were followed during the patient's care in the ED.  Some ED evaluations and interventions may be delayed as a result of limited staffing during the pandemic.*  Differential diagnosis includes, but is not limited to, biliary colic, acute on chronic abdominal pain, less likely aortic dissection or appendicitis.  The patient's vital signs are stable and she has been waiting in the lobby for almost 5 hours by the time she was brought to the exam room.  Comprehensive metabolic panel and urinalysis and lipase are all within normal limits.  She does have a very mild leukocytosis of 12.3 which is nonspecific.  She has no other signs or symptoms of infection.  I looked through the medical record and verified with her that she has had ultrasounds and CT scans in the past which have all been non-illuminating in terms of identifying the cause of her symptoms.  I explained to her that given how strongly her symptoms suggest biliary colic, I will evaluate with another ultrasound since her last ultrasound was about a year ago, but in the event that the ultrasound is negative I do not have an explanation for her acute on chronic pain at this time and she will need to follow-up with a gastroenterologist and/or her pain clinic.  She says that she understands and agrees with the plan.  Clinical Course as of Aug 31 638  Sun Aug 31, 2018  0635 Unremarkable ultrasound with no obvious explanation for her pain.  I will discharge as per my previous discussion and I am giving information about local gastroenterologist with whom she can follow-up if she chooses.   I am also giving my usual and customary return precautions.  US ABDOMEN LIMITED RUQ [CF]    Clinical Course User Index [CF] Loleta RoseForbach, Keysi Oelkers, MD     ____________________________________________  FINAL CLINICAL IMPRESSION(S) / ED DIAGNOSES  Final diagnoses:  Chronic abdominal pain     MEDICATIONS GIVEN DURING THIS VISIT:  Medications - No data to display   ED Discharge Orders    None       Note:  This document was prepared using Dragon voice recognition software and may include unintentional dictation errors.   Loleta RoseForbach, Quincey Nored, MD 08/31/18 [phone]

## 2018-08-31 NOTE — ED Notes (Signed)
Patient transported to Ultrasound 

## 2020-08-13 DIAGNOSIS — E119 Type 2 diabetes mellitus without complications: Secondary | ICD-10-CM | POA: Diagnosis not present

## 2020-08-13 DIAGNOSIS — Z7984 Long term (current) use of oral hypoglycemic drugs: Secondary | ICD-10-CM | POA: Insufficient documentation

## 2020-08-13 DIAGNOSIS — Z79899 Other long term (current) drug therapy: Secondary | ICD-10-CM | POA: Insufficient documentation

## 2020-08-13 DIAGNOSIS — I10 Essential (primary) hypertension: Secondary | ICD-10-CM | POA: Diagnosis not present

## 2020-08-13 DIAGNOSIS — B349 Viral infection, unspecified: Secondary | ICD-10-CM | POA: Insufficient documentation

## 2020-08-13 DIAGNOSIS — Z20822 Contact with and (suspected) exposure to covid-19: Secondary | ICD-10-CM | POA: Insufficient documentation

## 2020-08-13 DIAGNOSIS — Z28310 Unvaccinated for covid-19: Secondary | ICD-10-CM | POA: Insufficient documentation

## 2020-08-13 DIAGNOSIS — E876 Hypokalemia: Secondary | ICD-10-CM | POA: Insufficient documentation

## 2020-08-13 DIAGNOSIS — R519 Headache, unspecified: Secondary | ICD-10-CM | POA: Diagnosis present

## 2020-08-14 ENCOUNTER — Emergency Department: Payer: BC Managed Care – PPO

## 2020-08-14 ENCOUNTER — Other Ambulatory Visit: Payer: Self-pay

## 2020-08-14 ENCOUNTER — Encounter: Payer: Self-pay | Admitting: Emergency Medicine

## 2020-08-14 ENCOUNTER — Emergency Department
Admission: EM | Admit: 2020-08-14 | Discharge: 2020-08-14 | Disposition: A | Discharge status: Home / Self Care | Payer: BC Managed Care – PPO | Attending: Emergency Medicine | Admitting: Emergency Medicine

## 2020-08-14 DIAGNOSIS — E876 Hypokalemia: Secondary | ICD-10-CM

## 2020-08-14 DIAGNOSIS — B349 Viral infection, unspecified: Secondary | ICD-10-CM

## 2020-08-14 LAB — BASIC METABOLIC PANEL
Anion gap: 10 (ref 5–15)
BUN: 11 mg/dL (ref 6–20)
CO2: 25 mmol/L (ref 22–32)
Calcium: 9.1 mg/dL (ref 8.9–10.3)
Chloride: 103 mmol/L (ref 98–111)
Creatinine, Ser: 0.57 mg/dL (ref 0.44–1.00)
GFR, Estimated: >60 mL/min (ref >60)
Glucose, Bld: 153 mg/dL — ABNORMAL HIGH (ref 70–99)
Potassium: 3.2 mmol/L — ABNORMAL LOW (ref 3.5–5.1)
Sodium: 138 mmol/L (ref 135–145)

## 2020-08-14 LAB — CBC WITH DIFFERENTIAL/PLATELET
Abs Immature Granulocytes: 0.06 10*3/uL (ref 0.00–0.07)
Basophils Absolute: 0.1 10*3/uL (ref 0.0–0.1)
Basophils Relative: 1 %
Eosinophils Absolute: 0.2 10*3/uL (ref 0.0–0.5)
Eosinophils Relative: 2 %
HCT: 38.1 % (ref 36.0–46.0)
Hemoglobin: 13.1 g/dL (ref 12.0–15.0)
Immature Granulocytes: 1 %
Lymphocytes Relative: 36 %
Lymphs Abs: 3.4 10*3/uL (ref 0.7–4.0)
MCH: 26.5 pg (ref 26.0–34.0)
MCHC: 34.4 g/dL (ref 30.0–36.0)
MCV: 77 fL — ABNORMAL LOW (ref 80.0–100.0)
Monocytes Absolute: 0.5 10*3/uL (ref 0.1–1.0)
Monocytes Relative: 5 %
Neutro Abs: 5.4 10*3/uL (ref 1.7–7.7)
Neutrophils Relative %: 55 %
Platelets: 258 10*3/uL (ref 150–400)
RBC: 4.95 MIL/uL (ref 3.87–5.11)
RDW: 14.5 % (ref 11.5–15.5)
WBC: 9.5 10*3/uL (ref 4.0–10.5)
nRBC: 0 % (ref 0.0–0.2)

## 2020-08-14 LAB — URINALYSIS, COMPLETE (UACMP) WITH MICROSCOPIC
Bacteria, UA: NONE SEEN
Bilirubin Urine: NEGATIVE
Glucose, UA: NEGATIVE mg/dL
Hgb urine dipstick: NEGATIVE
Ketones, ur: NEGATIVE mg/dL
Leukocytes,Ua: NEGATIVE
Nitrite: NEGATIVE
Protein, ur: NEGATIVE mg/dL
Specific Gravity, Urine: 1.016 (ref 1.005–1.030)
pH: 6 (ref 5.0–8.0)

## 2020-08-14 LAB — RESP PANEL BY RT-PCR (FLU A&B, COVID) ARPGX2
Influenza A by PCR: NEGATIVE
Influenza B by PCR: NEGATIVE
SARS Coronavirus 2 by RT PCR: NEGATIVE

## 2020-08-14 MED ORDER — ONDANSETRON HCL 4 MG/2ML IJ SOLN
4.0000 mg | Freq: Once | INTRAMUSCULAR | Status: AC
  Administered 2020-08-14: 4 mg via INTRAVENOUS
  Filled 2020-08-14: qty 2

## 2020-08-14 MED ORDER — SODIUM CHLORIDE 0.9 % IV BOLUS
1000.0000 mL | Freq: Once | INTRAVENOUS | Status: AC
  Administered 2020-08-14: 1000 mL via INTRAVENOUS

## 2020-08-14 MED ORDER — KETOROLAC TROMETHAMINE 30 MG/ML IJ SOLN
30.0000 mg | Freq: Once | INTRAMUSCULAR | Status: AC
  Administered 2020-08-14: 30 mg via INTRAVENOUS
  Filled 2020-08-14: qty 1

## 2020-08-14 MED ORDER — POTASSIUM CHLORIDE CRYS ER 20 MEQ PO TBCR
40.0000 meq | EXTENDED_RELEASE_TABLET | Freq: Once | ORAL | Status: AC
  Administered 2020-08-14: 40 meq via ORAL
  Filled 2020-08-14: qty 2

## 2020-08-14 NOTE — ED Provider Notes (Signed)
[facility] Emergency Department Provider Note   ____________________________________________   Event Date/Time   First MD Initiated Contact with Patient 08/14/20 [phone]     (approximate)  I have reviewed the triage vital signs and the nursing notes.   HISTORY  Chief Complaint Migraine and Generalized Body Aches    HPI Jasmine Duncan is a 45 y.o. female who presents to the ED from home with a chief complaint of migraine headache and body aches.  Patient reports frontal headache constantly for the past 2 weeks associated with photophobia and right ear fullness and now body aches.  Could only work 2 hours tonight and left secondary to fatigue.  Endorses slight cough.  Denies fever, chest pain, shortness of breath, abdominal pain, nausea, vomiting or diarrhea.  Patient is unvaccinated against COVID-19.     Past Medical History:  Diagnosis Date  . Abdominal pain, chronic, right upper quadrant    has seen GI and pain clinic, no definitive diagnosis  . Anxiety   . Depression   . Diabetes mellitus without complication (HCC)   . GERD (gastroesophageal reflux disease)   . Hypertension   . Morbid obesity (HCC)     There are no problems to display for this patient.   Past Surgical History:  Procedure Laterality Date  . ABDOMINAL HYSTERECTOMY    . CERVIX REMOVAL      Prior to Admission medications   Medication Sig Start Date End Date Taking? Authorizing Provider  busPIRone (BUSPAR) 15 MG tablet Take 15 mg by mouth 2 (two) times daily. 07/24/16   [provider]  escitalopram (LEXAPRO) 20 MG tablet Take 20 mg by mouth daily. 08/25/16   [provider]  famotidine (PEPCID) 20 MG tablet Take 1 tablet (20 mg total) by mouth 2 (two) times daily. 12/11/16   Sharman Cheek, MD  hydrochlorothiazide (HYDRODIURIL) 25 MG tablet Take 25 mg by mouth daily. 08/25/16   [provider]  ibuprofen (ADVIL,MOTRIN) 800 MG tablet Take 1 tablet  (800 mg total) by mouth every 8 (eight) hours as needed for moderate pain. 06/08/16   Irean Hong, MD  metFORMIN (GLUCOPHAGE-XR) 500 MG 24 hr tablet Take 500 mg by mouth daily. 08/25/16   [provider]  metoCLOPramide (REGLAN) 10 MG tablet Take 1 tablet (10 mg total) by mouth every 6 (six) hours as needed. 12/11/16   Sharman Cheek, MD  sucralfate (CARAFATE) 1 g tablet Take 1 tablet (1 g total) by mouth 4 (four) times daily. 12/11/16   Sharman Cheek, MD    Allergies Patient has no known allergies.  No family history on file.  Social History Social History   Tobacco Use  . Smoking status: Never Smoker  . Smokeless tobacco: Never Used  Substance Use Topics  . Alcohol use: No  . Drug use: No    Review of Systems  Constitutional: Positive for body aches.  No fever/chills Eyes: No visual changes. ENT: Positive for right ear fullness.  No sore throat. Cardiovascular: Denies chest pain. Respiratory: Positive for cough.  Denies shortness of breath. Gastrointestinal: No abdominal pain.  No nausea, no vomiting.  No diarrhea.  No constipation. Genitourinary: Negative for dysuria. Musculoskeletal: Negative for back pain. Skin: Negative for rash. Neurological: Positive for headache. Negative for focal weakness or numbness.   ____________________________________________   PHYSICAL EXAM:  VITAL SIGNS: ED Triage Vitals [08/14/20 0009]  Enc Vitals Group     BP (!) 156/109     Pulse Rate 99  Resp 20     Temp 98.3 F (36.8 C)     Temp Source Oral     SpO2 98 %     Weight 270 lb 1 oz (122.5 kg)     Height 5\' 6"  (1.676 m)     Head Circumference      Peak Flow      Pain Score 8     Pain Loc      Pain Edu?      Excl. in [place]?     Constitutional: Alert and oriented. Well appearing and in no acute distress. Eyes: Conjunctivae are normal. PERRL. EOMI. Head: Atraumatic.  Frontal sinus mildly tender to palpation. Ears: Mild fluid behind right TM, otherwise  unremarkable. Nose: No congestion/rhinnorhea. Mouth/Throat: Mucous membranes are moist.  Oropharynx non-erythematous. Neck: No stridor.  No carotid bruits.  Supple neck without meningismus. Cardiovascular: Normal rate, regular rhythm. Grossly normal heart sounds.  Good peripheral circulation. Respiratory: Normal respiratory effort.  No retractions. Lungs CTAB. Gastrointestinal: Soft and nontender. No distention. No abdominal bruits. No CVA tenderness. Musculoskeletal: No lower extremity tenderness nor edema.  No joint effusions. Neurologic: Alert and oriented x3.  CN II to XII grossly intact.  Normal speech and language. No gross focal neurologic deficits are appreciated. MAEx4. No gait instability. Skin:  Skin is warm, dry and intact. No rash noted.  No petechiae. Psychiatric: Mood and affect are normal. Speech and behavior are normal.  ____________________________________________   LABS (all labs ordered are listed, but only abnormal results are displayed)  Labs Reviewed  CBC WITH DIFFERENTIAL/PLATELET - Abnormal; Notable for the following components:      Result Value   MCV 77.0 (*)    All other components within normal limits  BASIC METABOLIC PANEL - Abnormal; Notable for the following components:   Potassium 3.2 (*)    Glucose, Bld 153 (*)    All other components within normal limits  URINALYSIS, COMPLETE (UACMP) WITH MICROSCOPIC - Abnormal; Notable for the following components:   Color, Urine YELLOW (*)    APPearance CLEAR (*)    All other components within normal limits  RESP PANEL BY RT-PCR (FLU A&B, COVID) [id number]   ____________________________________________  EKG  None ____________________________________________  RADIOLOGY I, Blaike Vickers J, personally viewed and evaluated these images (plain radiographs) as part of my medical decision making, as well as reviewing the written report by the radiologist.  ED MD interpretation: No acute cardiopulmonary  process  Official radiology report(s): DG Chest 2 View  Result Date: 08/14/2020 CLINICAL DATA:  Cough EXAM: CHEST - 2 VIEW COMPARISON:  06/08/2016 FINDINGS: Lungs are well expanded, symmetric, and clear. No pneumothorax or pleural effusion. Cardiac size within normal limits. Pulmonary vascularity is normal. Osseous structures are age-appropriate. No acute bone abnormality. IMPRESSION: No active cardiopulmonary disease. Electronically Signed   By: 06/10/2016 MD   On: 08/14/2020 02:46    ____________________________________________   PROCEDURES  Procedure(s) performed (including Critical Care):  Procedures   ____________________________________________   INITIAL IMPRESSION / ASSESSMENT AND PLAN / ED COURSE  As part of my medical decision making, I reviewed the following data within the electronic MEDICAL RECORD NUMBER Nursing notes reviewed and incorporated, Labs reviewed, Old chart reviewed, Radiograph reviewed and Notes from prior ED visits     45 year old female presenting with headache, body aches, fatigue.  Differential diagnosis includes but is not limited to viral process, infectious, metabolic etiologies, etc.  No focal neurological deficits on examination; neck is supple without meningismus.  Will  replete potassium, infuse IV fluids, IV Toradol for body aches; check COVID swab and chest x-ray.  Will reassess.  Clinical Course as of 08/14/20 4268  Wynelle Link Aug 14, 2020  0340 Patient improved.  Updated her of all test results.  Advised NSAIDs, Tylenol and fluids.  Strict return precautions given.  Patient verbalizes understanding agrees with plan of care. [JS]    Clinical Course User Index [JS] Irean Hong, MD     ____________________________________________   FINAL CLINICAL IMPRESSION(S) / ED DIAGNOSES  Final diagnoses:  Viral illness  Hypokalemia     ED Discharge Orders    None       Note:  This document was prepared using Dragon voice recognition software and  may include unintentional dictation errors.   Irean Hong, MD 08/14/20 [phone]

## 2020-08-14 NOTE — Discharge Instructions (Signed)
You may take Tylenol and/or Ibuprofen as needed for body aches.  Drink plenty of fluids daily.  Return to the ER for worsening symptoms, persistent vomiting, difficulty breathing or other concerns.

## 2020-08-14 NOTE — ED Triage Notes (Signed)
Pt reports that she has had a migraine and body aches for the last two weeks. She states that she works third shift and went to work and could only last two hours and felt like she needed to leave and come to the ED to be evaluated.

## 2021-02-24 ENCOUNTER — Emergency Department
Admission: EM | Admit: 2021-02-24 | Discharge: 2021-02-24 | Disposition: A | Discharge status: Home / Self Care | Payer: BC Managed Care – PPO | Attending: Emergency Medicine | Admitting: Emergency Medicine

## 2021-02-24 ENCOUNTER — Encounter: Payer: Self-pay | Admitting: Emergency Medicine

## 2021-02-24 ENCOUNTER — Other Ambulatory Visit: Payer: Self-pay

## 2021-02-24 DIAGNOSIS — I10 Essential (primary) hypertension: Secondary | ICD-10-CM | POA: Insufficient documentation

## 2021-02-24 DIAGNOSIS — E119 Type 2 diabetes mellitus without complications: Secondary | ICD-10-CM | POA: Insufficient documentation

## 2021-02-24 DIAGNOSIS — Z7984 Long term (current) use of oral hypoglycemic drugs: Secondary | ICD-10-CM | POA: Insufficient documentation

## 2021-02-24 DIAGNOSIS — Z79899 Other long term (current) drug therapy: Secondary | ICD-10-CM | POA: Insufficient documentation

## 2021-02-24 DIAGNOSIS — M62830 Muscle spasm of back: Secondary | ICD-10-CM | POA: Insufficient documentation

## 2021-02-24 MED ORDER — KETOROLAC TROMETHAMINE 30 MG/ML IJ SOLN
30.0000 mg | Freq: Once | INTRAMUSCULAR | Status: AC
  Administered 2021-02-24: 30 mg via INTRAMUSCULAR
  Filled 2021-02-24: qty 1

## 2021-02-24 MED ORDER — CYCLOBENZAPRINE HCL 5 MG PO TABS: 5.0000 mg | ORAL_TABLET | Freq: Three times a day (TID) | ORAL | 0 refills | Status: AC | PRN

## 2021-02-24 NOTE — ED Notes (Signed)
Pt declined to have discharge vitals taken at this time. Pt alert and oriented times 4 with a steady gait.

## 2021-02-24 NOTE — ED Provider Notes (Signed)
[facility] Emergency Department Provider Note  ____________________________________________   Event Date/Time   First MD Initiated Contact with Patient 02/24/21 0215     (approximate)  I have reviewed the triage vital signs and the nursing notes.   HISTORY  Chief Complaint Back Pain    HPI Christalynn Boise is a 45 y.o. female who presents for evaluation of 3 months of back spasms.  She states that she had extensive podiatric surgery on her right foot in September, and she states that "ever since then my back has been hurting".  She said that after waiting healing and physical therapy with her right foot, she was finally cleared to return to work today, and that seems to have made her back spasms even worse.  She said that the pain is severe and feels like her back is cramping on both sides of her lower and middle back.    She has not had any recent falls or other trauma.  She is not having any difficulties with urinary retention, urinary frequency, nor bowel incontinence.  She has no pain in the midline of her back.  No numbness nor tingling.  She denies fever, sore throat, chest pain, shortness of breath, nausea, and vomiting.  Nothing in particular makes the pain better and moving around seems to make it worse although sometimes the muscle cramps will just hit her suddenly.     Past Medical History:  Diagnosis Date   Abdominal pain, chronic, right upper quadrant    has seen GI and pain clinic, no definitive diagnosis   Anxiety    Depression    Diabetes mellitus without complication (HCC)    GERD (gastroesophageal reflux disease)    Hypertension    Morbid obesity (HCC)     There are no problems to display for this patient.   Past Surgical History:  Procedure Laterality Date   ABDOMINAL HYSTERECTOMY     CERVIX REMOVAL      Prior to Admission medications   Medication Sig Start Date End Date Taking? Authorizing Provider  cyclobenzaprine  (FLEXERIL) 5 MG tablet Take 1 tablet (5 mg total) by mouth 3 (three) times daily as needed for muscle spasms. 02/24/21  Yes Loleta Rose, MD  busPIRone (BUSPAR) 15 MG tablet Take 15 mg by mouth 2 (two) times daily. 07/24/16   [provider]  escitalopram (LEXAPRO) 20 MG tablet Take 20 mg by mouth daily. 08/25/16   [provider]  famotidine (PEPCID) 20 MG tablet Take 1 tablet (20 mg total) by mouth 2 (two) times daily. 12/11/16   Sharman Cheek, MD  hydrochlorothiazide (HYDRODIURIL) 25 MG tablet Take 25 mg by mouth daily. 08/25/16   [provider]  ibuprofen (ADVIL,MOTRIN) 800 MG tablet Take 1 tablet (800 mg total) by mouth every 8 (eight) hours as needed for moderate pain. 06/08/16   Irean Hong, MD  metFORMIN (GLUCOPHAGE-XR) 500 MG 24 hr tablet Take 500 mg by mouth daily. 08/25/16   [provider]  metoCLOPramide (REGLAN) 10 MG tablet Take 1 tablet (10 mg total) by mouth every 6 (six) hours as needed. 12/11/16   Sharman Cheek, MD  sucralfate (CARAFATE) 1 g tablet Take 1 tablet (1 g total) by mouth 4 (four) times daily. 12/11/16   Sharman Cheek, MD    Allergies Patient has no known allergies.  No family history on file.  Social History Social History   Tobacco Use   Smoking status: Never   Smokeless tobacco: Never  Vaping Use   Vaping Use: Never used  Substance Use Topics   Alcohol use: No   Drug use: No    Review of Systems Constitutional: No fever/chills Cardiovascular: Denies chest pain. Respiratory: Denies shortness of breath. Gastrointestinal: No abdominal pain.  No nausea, no vomiting.  Denies bowel incontinence. Genitourinary: Denies urinary incontinence and urinary frequency. Musculoskeletal: Intermittent back spasms for 3 months, now worse. Integumentary: Negative for rash. Neurological: Negative for headaches, focal weakness or numbness.   ____________________________________________   PHYSICAL EXAM:  VITAL  SIGNS: ED Triage Vitals  Enc Vitals Group     BP 02/24/21 0038 127/74     Pulse Rate 02/24/21 0036 75     Resp 02/24/21 0038 16     Temp 02/24/21 0036 98.4 F (36.9 C)     Temp Source 02/24/21 0036 Oral     SpO2 02/24/21 0036 100 %     Weight 02/24/21 0039 115.2 kg (254 lb)     Height 02/24/21 0039 1.676 m (5\' 6" )     Head Circumference --      Peak Flow --      Pain Score 02/24/21 0039 10     Pain Loc --      Pain Edu? --      Excl. in GC? --     Constitutional: Alert and oriented.  Eyes: Conjunctivae are normal.  Head: Atraumatic. Cardiovascular: Normal rate, regular rhythm. Good peripheral circulation. Respiratory: Normal respiratory effort.  No retractions. Musculoskeletal: Patient reports significant tenderness to palpation all throughout the paraspinal muscles on bilateral sides of her lower thoracic and lumbar regions.  No palpable deformities.  No bony tenderness. Neurologic:  Normal speech and language. No gross focal neurologic deficits are appreciated.  Skin:  Skin is warm, dry and intact. Psychiatric: Mood and affect are normal. Speech and behavior are normal.  ____________________________________________    INITIAL IMPRESSION / MDM / ASSESSMENT AND PLAN / ED COURSE  As part of my medical decision making, I reviewed the following data within the electronic MEDICAL RECORD NUMBER Nursing notes reviewed and incorporated, Labs reviewed , Old chart reviewed, Notes from prior ED visits, and [place] Controlled Substance Database   Differential diagnosis includes but is not exclusive to musculoskeletal back pain, renal colic, urinary tract infection, pyelonephritis, intra-abdominal causes of back pain, aortic aneurysm or dissection, cauda equina syndrome, sciatica, lumbar disc disease, thoracic disc disease, etc.  Fortunately patient's exam is reassuring and her symptoms have been present for about 3 months.  No neurological deficits or concern of an immediate surgical issue or  something that requires MRI.  No recent trauma.  No urinary symptoms or abdominal symptoms.  It sounds as if she is having back spasms, most likely due to gait disturbances and changes because of her right foot surgery.  She also indicated that it was worse after returning to work for the first time today.  We discussed the presence of the back spasms I recommended that she try Flexeril for which I wrote her prescription.  She asked if perhaps some more time out of work would be appropriate but I encouraged her to try to return to normal usage of her back, stretch, etc., since immobility will lead to more stiffness and pain.  I provided follow-up information with neurosurgery should she wish to further evaluate her issue.  I gave my usual and customary return precautions.           ____________________________________________  FINAL CLINICAL IMPRESSION(S) / ED DIAGNOSES  Final diagnoses:  Spasm of back muscles     MEDICATIONS GIVEN DURING THIS VISIT:  Medications  ketorolac (TORADOL) 30 MG/ML injection 30 mg (30 mg Intramuscular Given 02/24/21 0238)     ED Discharge Orders          Ordered    cyclobenzaprine (FLEXERIL) 5 MG tablet  3 times daily PRN        02/24/21 0232             Note:  This document was prepared using Dragon voice recognition software and may include unintentional dictation errors.   Loleta Rose, MD 02/24/21 0500

## 2021-02-24 NOTE — ED Triage Notes (Signed)
Pt to triage via w/c with no distress noted; reports lower back pain & spasms since September; denies any known injury

## 2022-01-23 ENCOUNTER — Encounter: Payer: Self-pay | Admitting: *Deleted

## 2022-01-23 ENCOUNTER — Other Ambulatory Visit: Payer: Self-pay

## 2022-01-23 ENCOUNTER — Emergency Department
Admission: EM | Admit: 2022-01-23 | Discharge: 2022-01-23 | Disposition: A | Discharge status: Home / Self Care | Payer: BC Managed Care – PPO | Attending: Emergency Medicine | Admitting: Emergency Medicine

## 2022-01-23 ENCOUNTER — Emergency Department: Payer: BC Managed Care – PPO

## 2022-01-23 DIAGNOSIS — R079 Chest pain, unspecified: Secondary | ICD-10-CM

## 2022-01-23 DIAGNOSIS — K219 Gastro-esophageal reflux disease without esophagitis: Secondary | ICD-10-CM | POA: Diagnosis not present

## 2022-01-23 LAB — CBC
HCT: 37.6 % (ref 36.0–46.0)
Hemoglobin: 12.5 g/dL (ref 12.0–15.0)
MCH: 26 pg (ref 26.0–34.0)
MCHC: 33.2 g/dL (ref 30.0–36.0)
MCV: 78.2 fL — ABNORMAL LOW (ref 80.0–100.0)
Platelets: 253 10*3/uL (ref 150–400)
RBC: 4.81 MIL/uL (ref 3.87–5.11)
RDW: 14.1 % (ref 11.5–15.5)
WBC: 8.9 10*3/uL (ref 4.0–10.5)
nRBC: 0 % (ref 0.0–0.2)

## 2022-01-23 LAB — BASIC METABOLIC PANEL
Anion gap: 4 — ABNORMAL LOW (ref 5–15)
BUN: 12 mg/dL (ref 6–20)
CO2: 27 mmol/L (ref 22–32)
Calcium: 8.9 mg/dL (ref 8.9–10.3)
Chloride: 108 mmol/L (ref 98–111)
Creatinine, Ser: 0.55 mg/dL (ref 0.44–1.00)
GFR, Estimated: >60 mL/min (ref >60)
Glucose, Bld: 115 mg/dL — ABNORMAL HIGH (ref 70–99)
Potassium: 3.5 mmol/L (ref 3.5–5.1)
Sodium: 139 mmol/L (ref 135–145)

## 2022-01-23 LAB — TROPONIN I (HIGH SENSITIVITY)
Troponin I (High Sensitivity): 3 ng/L (ref <18)
Troponin I (High Sensitivity): 3 ng/L (ref <18)

## 2022-01-23 MED ORDER — FAMOTIDINE 20 MG PO TABS: 20.0000 mg | ORAL_TABLET | Freq: Every day | ORAL | 1 refills | Status: AC

## 2022-01-23 MED ORDER — OMEPRAZOLE 10 MG PO CPDR: 10.0000 mg | DELAYED_RELEASE_CAPSULE | Freq: Every day | ORAL | 6 refills | Status: AC

## 2022-01-23 NOTE — ED Triage Notes (Signed)
Pt has left side chest pain since yesterday.  Pt has nausea.  No v/d.  No cough.  No sob.  Nonsmoker.  Pt alert  speech clear.

## 2022-01-23 NOTE — ED Provider Notes (Signed)
[facility] Provider Note  Patient Contact: 8:51 PM (approximate)   History   Chest Pain   HPI  Jasmine Duncan is a 46 y.o. female who presents the emergency department complaining of intermittent chest pain.  Patient states that she does have a history of GERD, has had what appears to be biliary colic intermittently in the past as well.  Patient had eaten some spicy food and then developed some intermittent chest pain.  No cardiac history.  Patient initially came in with only intermittent chest pain but now states that she has some epigastric symptoms as well.  No emesis.  No other GI complaints.  No fevers or chills.     Physical Exam   Triage Vital Signs: ED Triage Vitals  Enc Vitals Group     BP 01/23/22 1634 (!) 155/92     Pulse Rate 01/23/22 1634 78     Resp 01/23/22 1634 18     Temp 01/23/22 1634 98.7 F (37.1 C)     Temp Source 01/23/22 1634 Oral     SpO2 01/23/22 1634 95 %     Weight 01/23/22 1631 254 lb (115.2 kg)     Height 01/23/22 1631 5\' 6"  (1.676 m)     Head Circumference --      Peak Flow --      Pain Score 01/23/22 1630 8     Pain Loc --      Pain Edu? --      Excl. in GC? --     Most recent vital signs: Vitals:   01/23/22 1634  BP: (!) 155/92  Pulse: 78  Resp: 18  Temp: 98.7 F (37.1 C)  SpO2: 95%     General: Alert and in no acute distress.  Cardiovascular:  Good peripheral perfusion.  No appreciable murmurs, rubs, gallops. Respiratory: Normal respiratory effort without tachypnea or retractions. Lungs CTAB. Good air entry to the bases with no decreased or absent breath sounds Gastrointestinal: Bowel sounds 4 quadrants. Soft and nontender to palpation. No guarding or rigidity. No palpable masses. No distention.  Musculoskeletal: Full range of motion to all extremities.  Neurologic:  No gross focal neurologic deficits are appreciated.  Skin:   No rash noted Other:   ED Results / Procedures / Treatments    Labs (all labs ordered are listed, but only abnormal results are displayed) Labs Reviewed  BASIC METABOLIC PANEL - Abnormal; Notable for the following components:      Result Value   Glucose, Bld 115 (*)    Anion gap 4 (*)    All other components within normal limits  CBC - Abnormal; Notable for the following components:   MCV 78.2 (*)    All other components within normal limits  TROPONIN I (HIGH SENSITIVITY)  TROPONIN I (HIGH SENSITIVITY)     EKG  ED ECG REPORT I, 01/25/22 Lenvil Swaim,  personally viewed and interpreted this ECG.   Date: 01/23/2022  EKG Time: 1632 hrs  Rate: 89 bpm  Rhythm: unchanged from previous tracings, normal sinus rhythm  Axis: Normal axis  Intervals:none  ST&T Change: No ST elevation or depression noted  Normal sinus rhythm.  No STEMI.  No significant change from previous EKG from 12/01/2018    RADIOLOGY  I personally viewed, evaluated, and interpreted these images as part of my medical decision making, as well as reviewing the written report by the radiologist.  ED Provider Interpretation: No acute cardiopulmonary findings on chest x-ray  DG Chest 2 View  Result Date: 01/23/2022 CLINICAL DATA:  Midsternal chest pain. Nausea. Symptoms since yesterday. EXAM: CHEST - 2 VIEW COMPARISON:  Chest two views 08/14/2020 FINDINGS: Cardiac silhouette and mediastinal contours are within normal limits. Mild calcification within aortic arch. The lungs are clear. No pleural effusion or pneumothorax. Moderate disc space narrowing of the lower thoracic spine, similar to prior. IMPRESSION: No active cardiopulmonary disease. Electronically Signed   By: Neita Garnet M.D.   On: 01/23/2022 16:48    PROCEDURES:  Critical Care performed: No  Procedures   MEDICATIONS ORDERED IN ED: Medications - No data to display   IMPRESSION / MDM / ASSESSMENT AND PLAN / ED COURSE  I reviewed the triage vital signs and the nursing notes.                               Differential diagnosis includes, but is not limited to, nonspecific chest pain, STEMI, NSTEMI, GERD, esophagitis, bronchitis, pneumonia  The patient is on the cardiac monitor to evaluate for evidence of arrhythmia and/or significant heart rate changes.  Patient's presentation is most consistent with acute presentation with potential threat to life or bodily function.   Patient's diagnosis is consistent with nonspecific chest pain, GERD.  Patient presents the emergency department with intermittent chest pain after eating some spicy food.  History of GERD.  Similar symptoms according to the patient to previous episodes with GERD.  Work-up with delta troponins, labs, chest x-ray and EKG are reassuring.  No evidence of STEMI or NSTEMI.  Suspect GERD component given the patient's symptoms and history.  We will start the patient on omeprazole and famotidine.  Patient has a close follow-up with her primary care provider.  Concerning signs and symptoms to return to the ED are discussed..  Patient is given ED precautions to return to the ED for any worsening or new symptoms.        FINAL CLINICAL IMPRESSION(S) / ED DIAGNOSES   Final diagnoses:  Nonspecific chest pain  Gastroesophageal reflux disease, unspecified whether esophagitis present     Rx / DC Orders   ED Discharge Orders          Ordered    omeprazole (PRILOSEC) 10 MG capsule  Daily        01/23/22 2059    famotidine (PEPCID) 20 MG tablet  Daily        01/23/22 2059             Note:  This document was prepared using Dragon voice recognition software and may include unintentional dictation errors.   Lanette Hampshire 01/23/22 2101    Georga Hacking, MD 01/23/22 2308

## 2022-05-09 IMAGING — CR DG CHEST 2V
1 series · 2 of 2 positions shown · non-contrast
Comparison: 06/08/2016

CLINICAL DATA: Cough

EXAM:
CHEST - 2 VIEW

[Series 1: dg chest 2 view · 0.14mm/px · 2 of 2 slices shown]
[im 1/2]
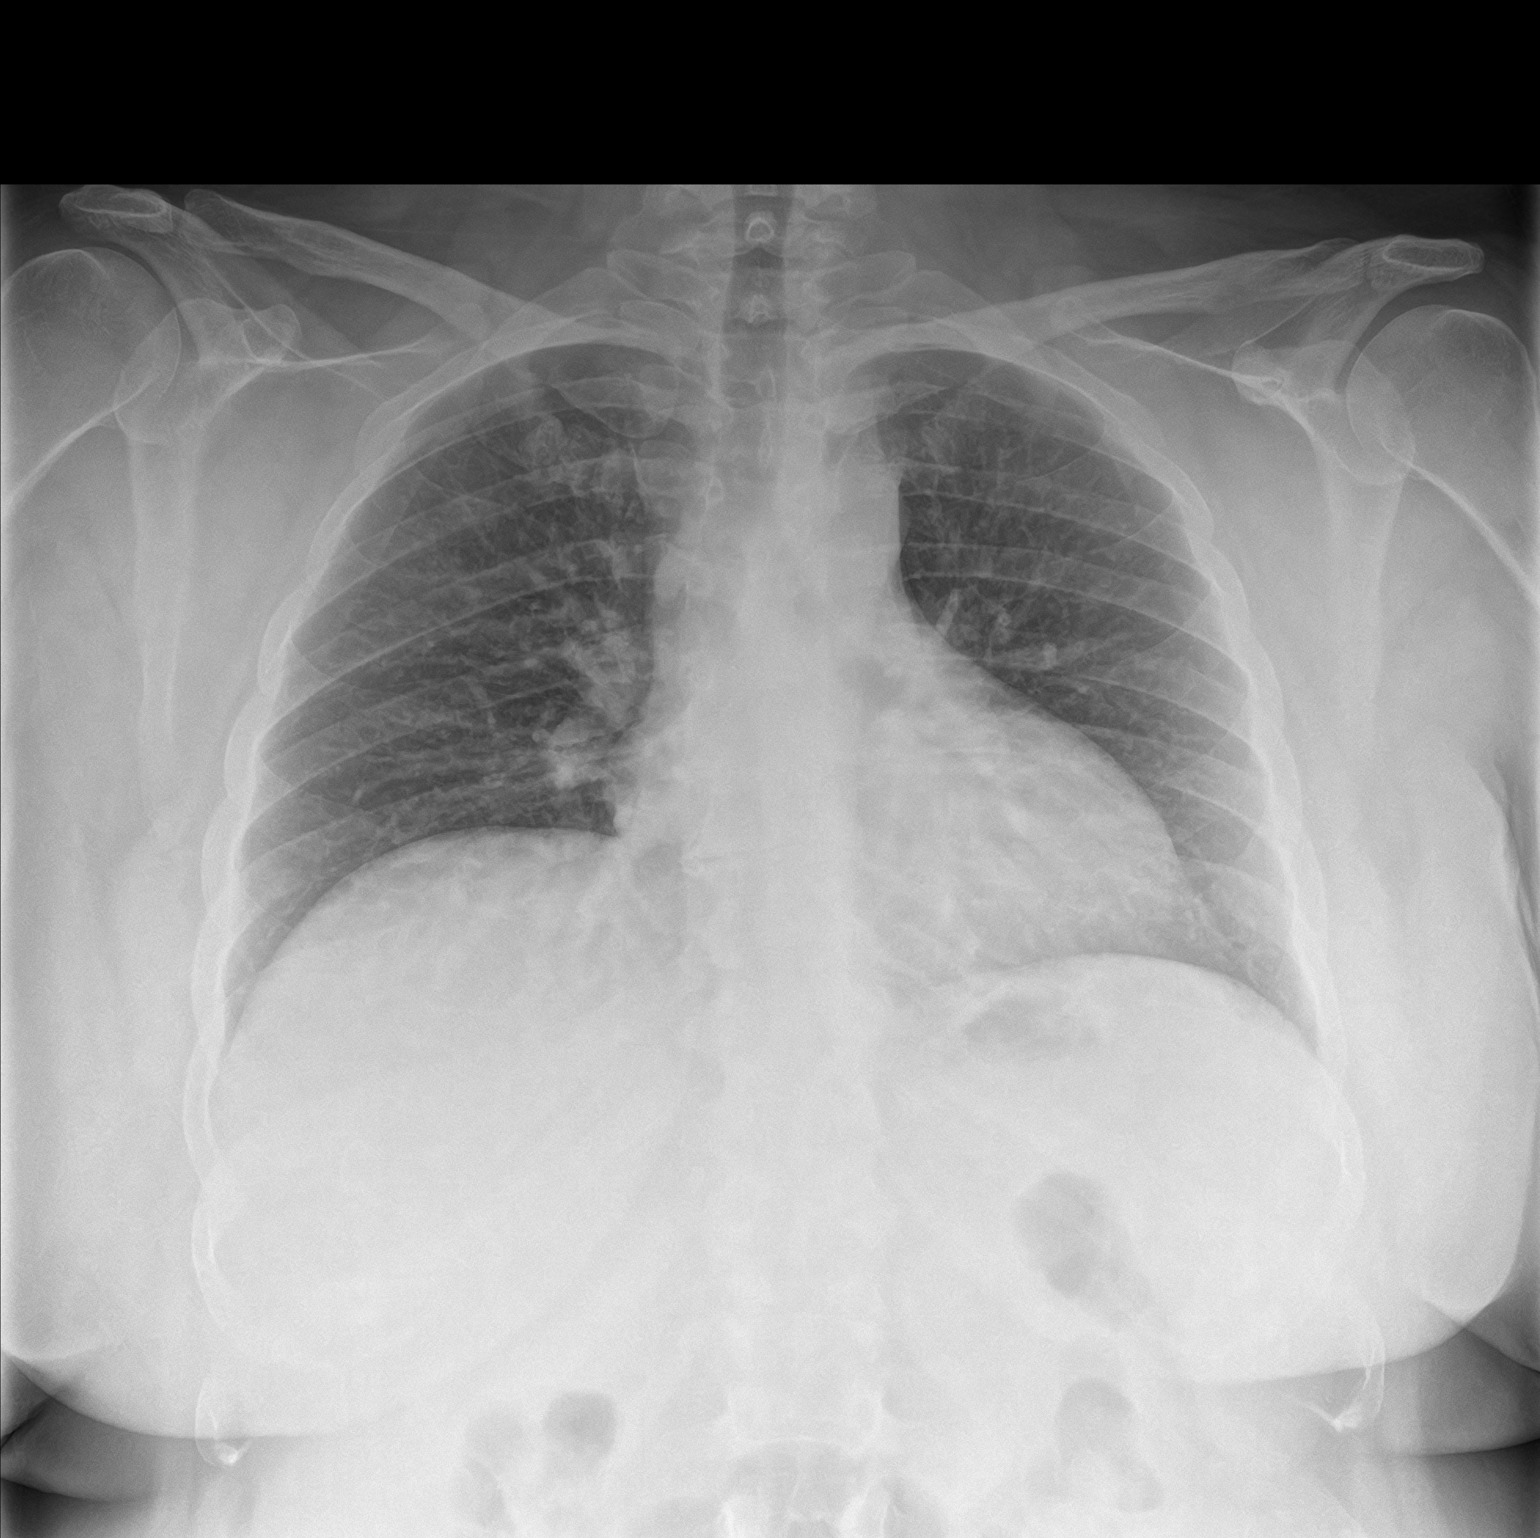
[im 2/2]
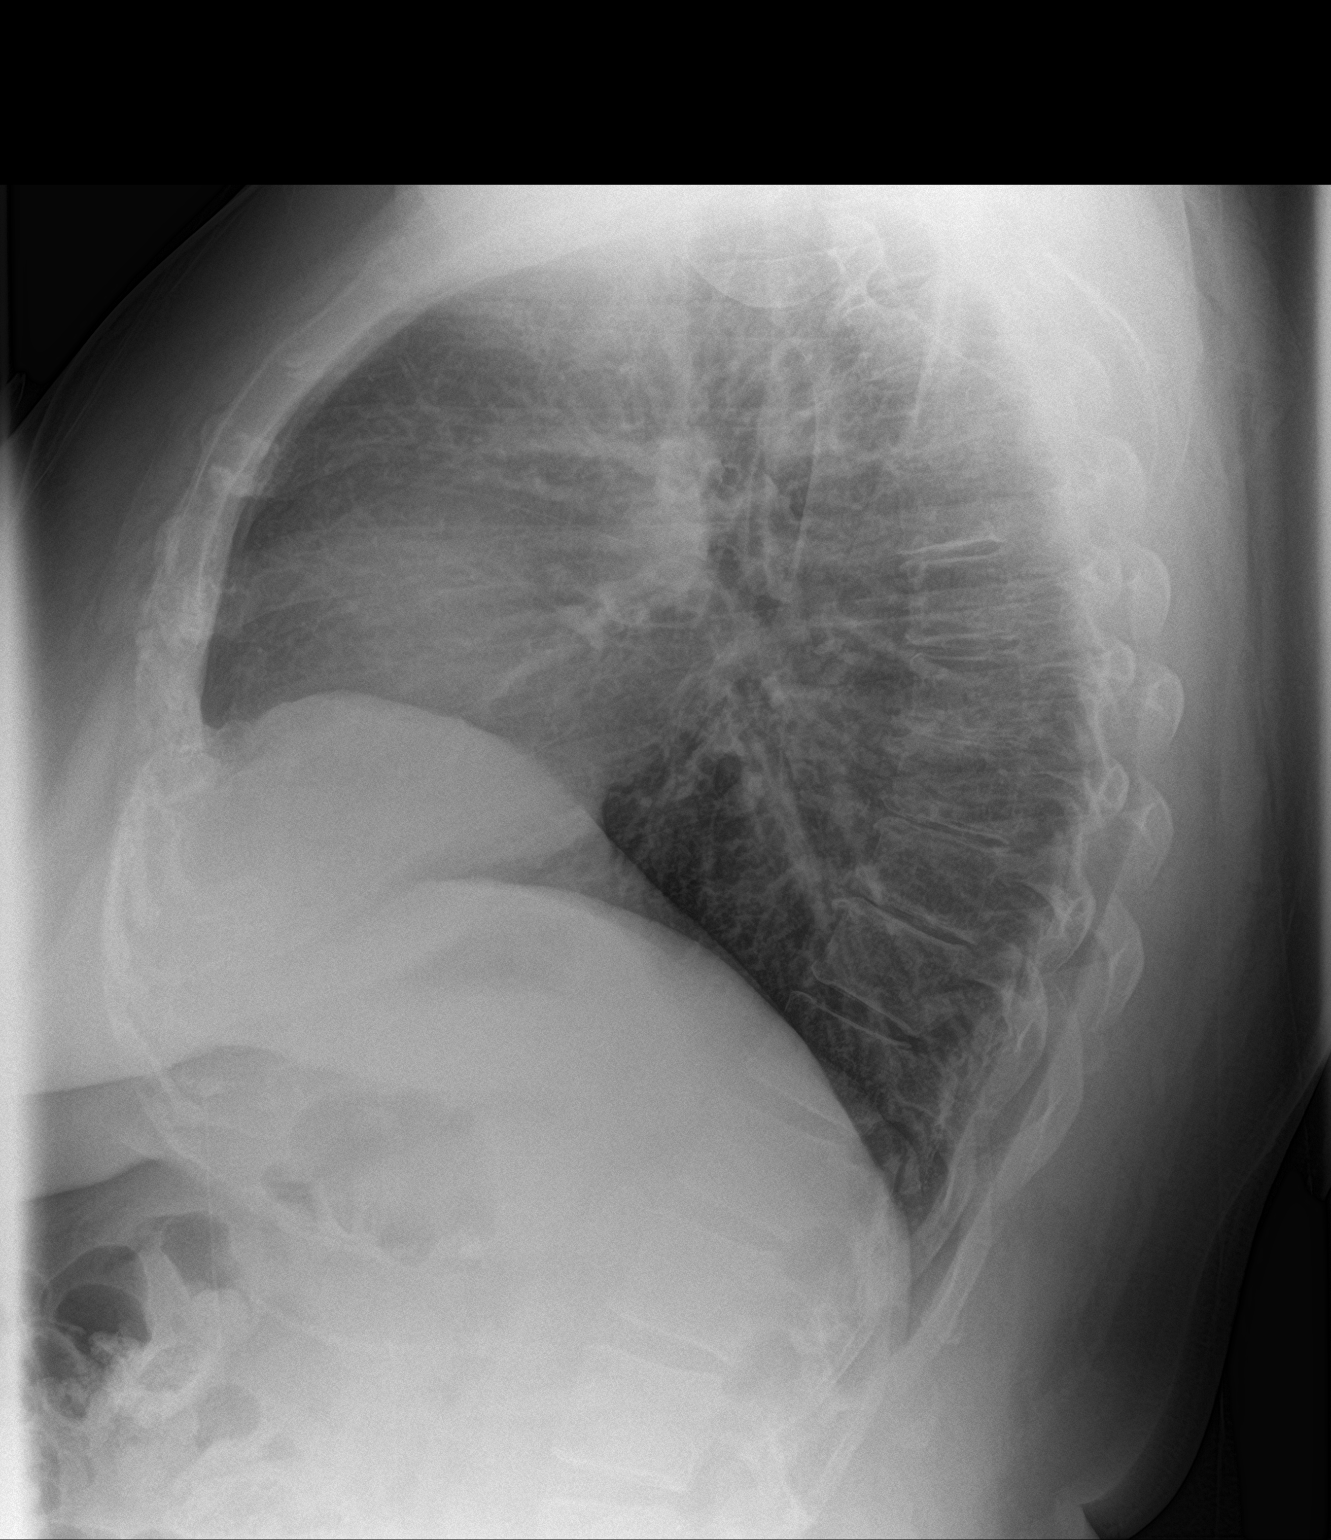

[2 of 2 positions shown; findings below may reference images not displayed]

FINDINGS: Lungs are well expanded, symmetric, and clear. No pneumothorax or
pleural effusion. Cardiac size within normal limits. Pulmonary
vascularity is normal. Osseous structures are age-appropriate. No
acute bone abnormality.
IMPRESSION: No active cardiopulmonary disease.

## 2022-11-11 ENCOUNTER — Emergency Department
Admission: EM | Admit: 2022-11-11 | Discharge: 2022-11-11 | Disposition: A | Discharge status: Home / Self Care | Payer: BC Managed Care – PPO | Attending: Emergency Medicine | Admitting: Emergency Medicine

## 2022-11-11 ENCOUNTER — Other Ambulatory Visit: Payer: Self-pay

## 2022-11-11 DIAGNOSIS — R519 Headache, unspecified: Secondary | ICD-10-CM | POA: Diagnosis present

## 2022-11-11 DIAGNOSIS — B349 Viral infection, unspecified: Secondary | ICD-10-CM | POA: Insufficient documentation

## 2022-11-11 DIAGNOSIS — Z20822 Contact with and (suspected) exposure to covid-19: Secondary | ICD-10-CM | POA: Diagnosis not present

## 2022-11-11 LAB — BASIC METABOLIC PANEL
Anion gap: 9 (ref 5–15)
BUN: 11 mg/dL (ref 6–20)
CO2: 26 mmol/L (ref 22–32)
Calcium: 8.6 mg/dL — ABNORMAL LOW (ref 8.9–10.3)
Chloride: 101 mmol/L (ref 98–111)
Creatinine, Ser: 0.5 mg/dL (ref 0.44–1.00)
GFR, Estimated: >60 mL/min (ref >60)
Glucose, Bld: 94 mg/dL (ref 70–99)
Potassium: 3.5 mmol/L (ref 3.5–5.1)
Sodium: 136 mmol/L (ref 135–145)

## 2022-11-11 LAB — CBC WITH DIFFERENTIAL/PLATELET
Abs Immature Granulocytes: 0.07 10*3/uL (ref 0.00–0.07)
Basophils Absolute: 0 10*3/uL (ref 0.0–0.1)
Basophils Relative: 0 %
Eosinophils Absolute: 0.2 10*3/uL (ref 0.0–0.5)
Eosinophils Relative: 2 %
HCT: 40.9 % (ref 36.0–46.0)
Hemoglobin: 13.5 g/dL (ref 12.0–15.0)
Immature Granulocytes: 1 %
Lymphocytes Relative: 27 %
Lymphs Abs: 2.6 10*3/uL (ref 0.7–4.0)
MCH: 26.1 pg (ref 26.0–34.0)
MCHC: 33 g/dL (ref 30.0–36.0)
MCV: 79.1 fL — ABNORMAL LOW (ref 80.0–100.0)
Monocytes Absolute: 0.5 10*3/uL (ref 0.1–1.0)
Monocytes Relative: 6 %
Neutro Abs: 6.1 10*3/uL (ref 1.7–7.7)
Neutrophils Relative %: 64 %
Platelets: 281 10*3/uL (ref 150–400)
RBC: 5.17 MIL/uL — ABNORMAL HIGH (ref 3.87–5.11)
RDW: 13.5 % (ref 11.5–15.5)
WBC: 9.5 10*3/uL (ref 4.0–10.5)
nRBC: 0 % (ref 0.0–0.2)

## 2022-11-11 LAB — SARS CORONAVIRUS 2 BY RT PCR: SARS Coronavirus 2 by RT PCR: NEGATIVE

## 2022-11-11 MED ORDER — SODIUM CHLORIDE 0.9 % IV BOLUS
1000.0000 mL | Freq: Once | INTRAVENOUS | Status: AC
  Administered 2022-11-11: 1000 mL via INTRAVENOUS

## 2022-11-11 NOTE — Discharge Instructions (Signed)
Please seek medical attention for any high fevers, chest pain, shortness of breath, change in behavior, persistent vomiting, bloody stool or any other new or concerning symptoms.  

## 2022-11-11 NOTE — ED Provider Notes (Signed)
[facility] Provider Note    Event Date/Time   First MD Initiated Contact with Patient 11/11/22 1858     (approximate)   History   Headache and Generalized Body Aches   HPI  Colean Sequin is a 47 y.o. female who presents to the emergency department today because of concerns for headache and bodyaches.  Patient's headache has been present for about a week.  It does wax and wane.  Over the past 3 days she has noticed extremity cramping and pain.  Additionally patient has felt some change in her taste.  Is concern for possible COVID given multiple coworkers have tested positive recently.     Physical Exam   Triage Vital Signs: ED Triage Vitals  Encounter Vitals Group     BP 11/11/22 1852 (!) 163/109     Systolic BP Percentile --      Diastolic BP Percentile --      Pulse Rate 11/11/22 1852 80     Resp 11/11/22 1852 16     Temp 11/11/22 1852 98.6 F (37 C)     Temp Source 11/11/22 1852 Oral     SpO2 11/11/22 1852 98 %     Weight 11/11/22 1851 253 lb 15.5 oz (115.2 kg)     Height 11/11/22 1851 5\' 6"  (1.676 m)     Head Circumference --      Peak Flow --      Pain Score 11/11/22 1851 8     Pain Loc --      Pain Education --      Exclude from Growth Chart --     Most recent vital signs: Vitals:   11/11/22 1852  BP: (!) 163/109  Pulse: 80  Resp: 16  Temp: 98.6 F (37 C)  SpO2: 98%   General: Awake, alert, oriented. CV:  Good peripheral perfusion. Regular rate and rhythm. Resp:  Normal effort. Lungs clear. Abd:  No distention.    ED Results / Procedures / Treatments   Labs (all labs ordered are listed, but only abnormal results are displayed) Labs Reviewed  SARS CORONAVIRUS 2 BY RT PCR  CBC WITH DIFFERENTIAL/PLATELET  BASIC METABOLIC PANEL     EKG  None   RADIOLOGY None    PROCEDURES:  Critical Care performed: No    MEDICATIONS ORDERED IN ED: Medications  sodium chloride 0.9 % bolus 1,000 mL (has no  administration in time range)     IMPRESSION / MDM / ASSESSMENT AND PLAN / ED COURSE  I reviewed the triage vital signs and the nursing notes.                              Differential diagnosis includes, but is not limited to, covid, other viral URI, dehydration, electrolyte abnormality.  Patient's presentation is most consistent with acute presentation with potential threat to life or bodily function.   The patient is on the cardiac monitor to evaluate for evidence of arrhythmia and/or significant heart rate changes.  Patient presents to the emergency department today because of concerns for headache and body aches.  COVID test was negative.  Will check blood work for electrolyte abnormalities, will give IV fluids.   Blood work without concerning leukocytosis or electrolyte abnormality.  At this time I think it is reasonable for patient to be discharged.  Will give patient note for work.      FINAL CLINICAL IMPRESSION(S) / ED  DIAGNOSES   Final diagnoses:  Viral syndrome     Note:  This document was prepared using Dragon voice recognition software and may include unintentional dictation errors.    Phineas Semen, MD 11/11/22 2102

## 2022-11-11 NOTE — ED Triage Notes (Signed)
Pt here with body aches and a headache. Pt also having leg and arm cramping and a loss of taste. Pt states a lot of her co workers have covid and she would like to be tested.

## 2022-11-11 NOTE — ED Notes (Signed)
Pt bent arm occluding infusion, will d/c once fluids are finished.

## 2023-01-14 ENCOUNTER — Other Ambulatory Visit: Payer: Self-pay

## 2023-01-14 ENCOUNTER — Emergency Department
Admission: EM | Admit: 2023-01-14 | Discharge: 2023-01-14 | Disposition: A | Discharge status: Home / Self Care | Payer: BC Managed Care – PPO | Attending: Emergency Medicine | Admitting: Emergency Medicine

## 2023-01-14 ENCOUNTER — Emergency Department: Payer: BC Managed Care – PPO

## 2023-01-14 DIAGNOSIS — M79671 Pain in right foot: Secondary | ICD-10-CM | POA: Insufficient documentation

## 2023-01-14 DIAGNOSIS — E119 Type 2 diabetes mellitus without complications: Secondary | ICD-10-CM | POA: Insufficient documentation

## 2023-01-14 DIAGNOSIS — I10 Essential (primary) hypertension: Secondary | ICD-10-CM | POA: Insufficient documentation

## 2023-01-14 MED ORDER — GABAPENTIN 100 MG PO CAPS: 100.0000 mg | ORAL_CAPSULE | Freq: Three times a day (TID) | ORAL | 1 refills | Status: AC

## 2023-01-14 NOTE — ED Notes (Addendum)
See triage note Presents with right ankle pain Denies any recent injury  No deformity noted Good pulses  States her surgery was about 1 year ago  Min swelling noted

## 2023-01-14 NOTE — ED Triage Notes (Signed)
Pt comes with c/o right ankle pain. Pt stats this happens on and off. Pt stats last night it got worse. Pt has had surgery on foot in past.

## 2023-01-14 NOTE — Discharge Instructions (Signed)
As we discussed please begin taking her gabapentin for discomfort.  This medicine may make you tired, please try the medication on a day where you do not have to drive or work.  Please follow-up with your orthopedic surgeon as soon as you are able for recheck/reevaluation.

## 2023-01-14 NOTE — ED Provider Notes (Signed)
   [facility] Provider Note    Event Date/Time   First MD Initiated Contact with Patient 01/14/23 [phone]     (approximate)  History   Chief Complaint: Ankle Pain  HPI  Jasmine Duncan is a 47 y.o. female with a past medical history of anxiety, depression, diabetes, gastric reflux, hypertension, presents to the emergency department for right foot pain.  According to the patient 1.5 years ago she had surgery performed on the right foot.  She states since that time she goes through episodes of pain in the foot to the point where she has trouble walking.  Patient states she works overnight on her feet for 12 hours at a time.  States she had to leave work last night because the pain started to increase again.  Patient describes as a sharp shooting pain from the foot almost like a burning sensation.  Her description sounds very consistent with neuropathic pain.  Denies any trauma or new trauma at least.  Physical Exam   Triage Vital Signs: ED Triage Vitals  Encounter Vitals Group     BP 01/14/23 0708 (!) 162/90     Systolic BP Percentile --      Diastolic BP Percentile --      Pulse Rate 01/14/23 0708 77     Resp 01/14/23 0708 18     Temp 01/14/23 0708 98 F (36.7 C)     Temp src --      SpO2 01/14/23 0708 100 %     Weight 01/14/23 0736 253 lb 15.5 oz (115.2 kg)     Height 01/14/23 0736 5\' 6"  (1.676 m)     Head Circumference --      Peak Flow --      Pain Score 01/14/23 0706 10     Pain Loc --      Pain Education --      Exclude from Growth Chart --     Most recent vital signs: Vitals:   01/14/23 0708  BP: (!) 162/90  Pulse: 77  Resp: 18  Temp: 98 F (36.7 C)  SpO2: 100%    General: Awake, no distress.  CV:  Good peripheral perfusion Resp:  Normal effort.  Abd:  No distention.   Other:  Minimal pedal edema.  Patient has well-healed surgical incisions on the foot.  Neurovascularly intact   ED Results / Procedures / Treatments    RADIOLOGY  I have reviewed the foot x-ray images.  No obvious fracture or significant abnormality seen on my evaluation.  Surgical hardware in place.   MEDICATIONS ORDERED IN ED: Medications - No data to display   IMPRESSION / MDM / ASSESSMENT AND PLAN / ED COURSE  I reviewed the triage vital signs and the nursing notes.  Patient's presentation is most consistent with acute illness / injury with system symptoms.  Patient presents the emergency department for right foot pain.  States it flares up every few months.  Patient's description of the pain seems consistent with neuropathic pain.  Discussed with the patient a trial of gabapentin 100 mg 3 times daily and have the patient follow-up with her orthopedist.  Patient is agreeable to this plan of care.  Is requesting a work note for today and tomorrow.  FINAL CLINICAL IMPRESSION(S) / ED DIAGNOSES   Right foot pain   Note:  This document was prepared using Dragon voice recognition software and may include unintentional dictation errors.   Minna Antis, MD 01/14/23 [phone]

## 2023-03-24 ENCOUNTER — Emergency Department
Admission: EM | Admit: 2023-03-24 | Discharge: 2023-03-25 | Disposition: A | Discharge status: Home / Self Care | Payer: BC Managed Care – PPO | Attending: Emergency Medicine | Admitting: Emergency Medicine

## 2023-03-24 ENCOUNTER — Other Ambulatory Visit: Payer: Self-pay

## 2023-03-24 DIAGNOSIS — E119 Type 2 diabetes mellitus without complications: Secondary | ICD-10-CM | POA: Insufficient documentation

## 2023-03-24 DIAGNOSIS — I10 Essential (primary) hypertension: Secondary | ICD-10-CM | POA: Insufficient documentation

## 2023-03-24 DIAGNOSIS — J0100 Acute maxillary sinusitis, unspecified: Secondary | ICD-10-CM | POA: Insufficient documentation

## 2023-03-24 DIAGNOSIS — Z79899 Other long term (current) drug therapy: Secondary | ICD-10-CM | POA: Insufficient documentation

## 2023-03-24 DIAGNOSIS — R519 Headache, unspecified: Secondary | ICD-10-CM | POA: Diagnosis present

## 2023-03-24 DIAGNOSIS — Z7984 Long term (current) use of oral hypoglycemic drugs: Secondary | ICD-10-CM | POA: Insufficient documentation

## 2023-03-24 DIAGNOSIS — Z20822 Contact with and (suspected) exposure to covid-19: Secondary | ICD-10-CM | POA: Diagnosis not present

## 2023-03-24 NOTE — ED Triage Notes (Addendum)
 Pt reports headache x 2 weeks associated with fatigue, leg cramping, body aches and bilateral ear pain. She also reports sinus pressure.

## 2023-03-25 LAB — CBC WITH DIFFERENTIAL/PLATELET
Abs Immature Granulocytes: 0.06 10*3/uL (ref 0.00–0.07)
Basophils Absolute: 0 10*3/uL (ref 0.0–0.1)
Basophils Relative: 0 %
Eosinophils Absolute: 0.1 10*3/uL (ref 0.0–0.5)
Eosinophils Relative: 1 %
HCT: 37.3 % (ref 36.0–46.0)
Hemoglobin: 12.5 g/dL (ref 12.0–15.0)
Immature Granulocytes: 1 %
Lymphocytes Relative: 31 %
Lymphs Abs: 3 10*3/uL (ref 0.7–4.0)
MCH: 26.6 pg (ref 26.0–34.0)
MCHC: 33.5 g/dL (ref 30.0–36.0)
MCV: 79.4 fL — ABNORMAL LOW (ref 80.0–100.0)
Monocytes Absolute: 0.5 10*3/uL (ref 0.1–1.0)
Monocytes Relative: 6 %
Neutro Abs: 6 10*3/uL (ref 1.7–7.7)
Neutrophils Relative %: 61 %
Platelets: 282 10*3/uL (ref 150–400)
RBC: 4.7 MIL/uL (ref 3.87–5.11)
RDW: 13.7 % (ref 11.5–15.5)
WBC: 9.8 10*3/uL (ref 4.0–10.5)
nRBC: 0 % (ref 0.0–0.2)

## 2023-03-25 LAB — COMPREHENSIVE METABOLIC PANEL
ALT: 20 U/L (ref 0–44)
AST: 15 U/L (ref 15–41)
Albumin: 3.7 g/dL (ref 3.5–5.0)
Alkaline Phosphatase: 91 U/L (ref 38–126)
Anion gap: 9 (ref 5–15)
BUN: 12 mg/dL (ref 6–20)
CO2: 25 mmol/L (ref 22–32)
Calcium: 8.6 mg/dL — ABNORMAL LOW (ref 8.9–10.3)
Chloride: 103 mmol/L (ref 98–111)
Creatinine, Ser: 0.63 mg/dL (ref 0.44–1.00)
GFR, Estimated: >60 mL/min (ref >60)
Glucose, Bld: 95 mg/dL (ref 70–99)
Potassium: 3.7 mmol/L (ref 3.5–5.1)
Sodium: 137 mmol/L (ref 135–145)
Total Bilirubin: 0.4 mg/dL (ref 0.0–1.2)
Total Protein: 7.1 g/dL (ref 6.5–8.1)

## 2023-03-25 LAB — GROUP A STREP BY PCR: Group A Strep by PCR: NOT DETECTED

## 2023-03-25 LAB — RESP PANEL BY RT-PCR (RSV, FLU A&B, COVID)  RVPGX2
Influenza A by PCR: NEGATIVE
Influenza B by PCR: NEGATIVE
Resp Syncytial Virus by PCR: NEGATIVE
SARS Coronavirus 2 by RT PCR: NEGATIVE

## 2023-03-25 LAB — CK: Total CK: 53 U/L (ref 38–234)

## 2023-03-25 LAB — MAGNESIUM: Magnesium: 2.1 mg/dL (ref 1.7–2.4)

## 2023-03-25 MED ORDER — KETOROLAC TROMETHAMINE 30 MG/ML IJ SOLN
30.0000 mg | Freq: Once | INTRAMUSCULAR | Status: AC
  Administered 2023-03-25: 30 mg via INTRAVENOUS
  Filled 2023-03-25: qty 1

## 2023-03-25 MED ORDER — AMOXICILLIN-POT CLAVULANATE 875-125 MG PO TABS
1.0000 | ORAL_TABLET | Freq: Once | ORAL | Status: AC
  Administered 2023-03-25: 1 via ORAL
  Filled 2023-03-25: qty 1

## 2023-03-25 MED ORDER — SODIUM CHLORIDE 0.9 % IV BOLUS (SEPSIS)
1000.0000 mL | Freq: Once | INTRAVENOUS | Status: AC
  Administered 2023-03-25: 1000 mL via INTRAVENOUS

## 2023-03-25 MED ORDER — ONDANSETRON HCL 4 MG/2ML IJ SOLN
4.0000 mg | Freq: Once | INTRAMUSCULAR | Status: AC
  Administered 2023-03-25: 4 mg via INTRAVENOUS
  Filled 2023-03-25: qty 2

## 2023-03-25 MED ORDER — AMOXICILLIN-POT CLAVULANATE 875-125 MG PO TABS
1.0000 | ORAL_TABLET | Freq: Two times a day (BID) | ORAL | 0 refills | Status: AC
Start: 2023-03-25 — End: ?

## 2023-03-25 MED ORDER — DEXAMETHASONE SODIUM PHOSPHATE 10 MG/ML IJ SOLN
10.0000 mg | Freq: Once | INTRAMUSCULAR | Status: AC
  Administered 2023-03-25: 10 mg via INTRAVENOUS
  Filled 2023-03-25: qty 1

## 2023-03-25 NOTE — Discharge Instructions (Addendum)
You may alternate Tylenol 1000 mg every 6 hours as needed for pain, fever and Ibuprofen 800 mg every 6-8 hours as needed for pain, fever.  Please take Ibuprofen with food.  Do not take more than 4000 mg of Tylenol (acetaminophen) in a 24 hour period.   Please go to the following website to schedule new (and existing) patient appointments:   [email or web address]   The following is a list of primary care offices in the area who are accepting new patients at this time.  Please reach out to one of them directly and let them know you would like to schedule an appointment to follow up on an Emergency Department visit, and/or to establish a new primary care provider (PCP).  There are likely other primary care clinics in the are who are accepting new patients, but this is an excellent place to start:  [facility] physician: Dr Lavon Paganini [address] [place], [place] [address] [phone]  [facility] Lead Physician: Dr Steele Sizer [address], [place], [place] [address] [phone]  [facility] Physician: Dr Park Liter [address] [place], [place] [address] [phone]  [facility] Lead Physician: Dr Dewaine Oats [address] [place], [place] [address] [phone]  [facility] Physician: Dr Halina Maidens [address] [place], [place], [place] [address] [phone]

## 2023-03-25 NOTE — ED Provider Notes (Signed)
 [facility] Provider Note    Event Date/Time   First MD Initiated Contact with Patient 03/24/23 2332     (approximate)   History   Headache   HPI  Schuyler Behan is a 48 y.o. female with history of diabetes, hypertension, depression, obesity who presents to the emergency department with multiple complaints.  Reports for 2 weeks she has not felt like herself.  She has had headache, sinus pressure, ear pain, sore throat.  No cough, chest pain or shortness of breath.  She also reports having cramps in her legs and feeling weak.  No vomiting, diarrhea.  Has not seen her PCP for this.  No head injury.  No numbness, tingling or weakness.  No known fevers.   History provided by patient.    Past Medical History:  Diagnosis Date   Abdominal pain, chronic, right upper quadrant    has seen GI and pain clinic, no definitive diagnosis   Anxiety    Depression    Diabetes mellitus without complication (HCC)    GERD (gastroesophageal reflux disease)    Hypertension    Morbid obesity (HCC)     Past Surgical History:  Procedure Laterality Date   ABDOMINAL HYSTERECTOMY     CERVIX REMOVAL      MEDICATIONS:  Prior to Admission medications   Medication Sig Start Date End Date Taking? Authorizing Provider  busPIRone (BUSPAR) 15 MG tablet Take 15 mg by mouth 2 (two) times daily. 07/24/16   [provider]  cyclobenzaprine  (FLEXERIL ) 5 MG tablet Take 1 tablet (5 mg total) by mouth 3 (three) times daily as needed for muscle spasms. 02/24/21   Gordan Huxley, MD  escitalopram (LEXAPRO) 20 MG tablet Take 20 mg by mouth daily. 08/25/16   [provider]  famotidine  (PEPCID ) 20 MG tablet Take 1 tablet (20 mg total) by mouth daily. 01/23/22 01/23/23  Cuthriell, Dorn BIRCH, PA-C  gabapentin  (NEURONTIN ) 100 MG capsule Take 1 capsule (100 mg total) by mouth 3 (three) times daily. 01/14/23 01/14/24  Paduchowski, Kevin, MD  hydrochlorothiazide (HYDRODIURIL) 25 MG  tablet Take 25 mg by mouth daily. 08/25/16   [provider]  ibuprofen  (ADVIL ,MOTRIN ) 800 MG tablet Take 1 tablet (800 mg total) by mouth every 8 (eight) hours as needed for moderate pain. 06/08/16   Sung, Jade J, MD  metFORMIN (GLUCOPHAGE-XR) 500 MG 24 hr tablet Take 500 mg by mouth daily. 08/25/16   [provider]  metoCLOPramide  (REGLAN ) 10 MG tablet Take 1 tablet (10 mg total) by mouth every 6 (six) hours as needed. 12/11/16   Viviann Pastor, MD  omeprazole  (PRILOSEC) 10 MG capsule Take 1 capsule (10 mg total) by mouth daily. 01/23/22   Cuthriell, Jonathan D, PA-C  sucralfate  (CARAFATE ) 1 g tablet Take 1 tablet (1 g total) by mouth 4 (four) times daily. 12/11/16   Viviann Pastor, MD    Physical Exam   Triage Vital Signs: ED Triage Vitals  Encounter Vitals Group     BP 03/24/23 2310 (!) 146/92     Systolic BP Percentile --      Diastolic BP Percentile --      Pulse Rate 03/24/23 2310 79     Resp 03/24/23 2310 17     Temp 03/24/23 2310 98.4 F (36.9 C)     Temp Source 03/24/23 2310 Oral     SpO2 03/24/23 2310 100 %     Weight 03/24/23 2309 235 lb (106.6 kg)  Height 03/24/23 2309 5' 6 (1.676 m)     Head Circumference --      Peak Flow --      Pain Score 03/24/23 2309 8     Pain Loc --      Pain Education --      Exclude from Growth Chart --     Most recent vital signs: Vitals:   03/24/23 2310 03/25/23 0130  BP: (!) 146/92 132/66  Pulse: 79 66  Resp: 17 18  Temp: 98.4 F (36.9 C)   SpO2: 100% 100%    CONSTITUTIONAL: Alert, responds appropriately to questions. Well-appearing; well-nourished HEAD: Normocephalic, atraumatic EYES: Conjunctivae clear, pupils appear equal, sclera nonicteric ENT: normal nose; moist mucous membranes; No pharyngeal erythema or petechiae, mild tonsillar hypertrophy without exudate, no uvular deviation, no unilateral swelling in posterior oropharynx, no trismus or drooling, no muffled voice, normal phonation, no stridor,  airway patent.  TMs are clear bilaterally without erythema, purulence, bulging, perforation, effusion.  No cerumen impaction or sign of foreign body in the external auditory canal. No inflammation, erythema or drainage from the external auditory canal. No signs of mastoiditis. No pain with manipulation of the pinna bilaterally. NECK: Supple, normal ROM CARD: RRR; S1 and S2 appreciated RESP: Normal chest excursion without splinting or tachypnea; breath sounds clear and equal bilaterally; no wheezes, no rhonchi, no rales, no hypoxia or respiratory distress, speaking full sentences ABD/GI: Non-distended; soft, non-tender, no rebound, no guarding, no peritoneal signs BACK: The back appears normal EXT: Normal ROM in all joints; no deformity noted, no edema SKIN: Normal color for age and race; warm; no rash on exposed skin NEURO: Moves all extremities equally, normal speech, ambulates with slightly antalgic gait, no facial asymmetry, normal sensation PSYCH: The patient's mood and manner are appropriate.   ED Results / Procedures / Treatments   LABS: (all labs ordered are listed, but only abnormal results are displayed) Labs Reviewed  CBC WITH DIFFERENTIAL/PLATELET - Abnormal; Notable for the following components:      Result Value   MCV 79.4 (*)    All other components within normal limits  COMPREHENSIVE METABOLIC PANEL - Abnormal; Notable for the following components:   Calcium 8.6 (*)    All other components within normal limits  RESP PANEL BY RT-PCR (RSV, FLU A&B, COVID)  [id number]  GROUP A STREP BY PCR  CK  MAGNESIUM     EKG:  EKG Interpretation Date/Time:    Ventricular Rate:    PR Interval:    QRS Duration:    QT Interval:    QTC Calculation:   R Axis:      Text Interpretation:           RADIOLOGY: My personal review and interpretation of imaging:    I have personally reviewed all radiology reports.   No results found.   PROCEDURES:  Critical Care performed:  No    Procedures    IMPRESSION / MDM / ASSESSMENT AND PLAN / ED COURSE  I reviewed the triage vital signs and the nursing notes.    Patient here with multiple complaints.    DIFFERENTIAL DIAGNOSIS (includes but not limited to):   Viral illness, sinusitis, doubt pneumonia, bacteremia, meningitis, sepsis.  Differential also includes anemia, electrolyte derangement, dehydration, migraine.  Low suspicion for intracranial hemorrhage, CVT, stroke.   Patient's presentation is most consistent with acute presentation with potential threat to life or bodily function.   PLAN: Will obtain basic labs, strep swab.  COVID, flu and  RSV negative.  Will give IV fluids, Toradol , Decadron  and Zofran  for symptomatic relief and reassess.   MEDICATIONS GIVEN IN ED: Medications  sodium chloride  0.9 % bolus 1,000 mL (1,000 mLs Intravenous New Bag/Given 03/25/23 0144)  amoxicillin -clavulanate (AUGMENTIN ) 875-125 MG per tablet 1 tablet (has no administration in time range)  ketorolac  (TORADOL ) 30 MG/ML injection 30 mg (30 mg Intravenous Given 03/25/23 0105)  dexamethasone  (DECADRON ) injection 10 mg (10 mg Intravenous Given 03/25/23 0105)  ondansetron  (ZOFRAN ) injection 4 mg (4 mg Intravenous Given 03/25/23 0105)     ED COURSE: Labs show normal hemoglobin.  No leukocytosis.  Normal electrolytes.  CK normal.  Strep test negative.  Patient reports headache has completely resolved.  Given length of symptoms of sinusitis, will treat with Augmentin  for potential bacterial causes.  First dose given here in the emergency department.  Recommended Tylenol , Motrin  over-the-counter as needed for discomfort.  Patient comfortable with this plan.   At this time, I do not feel there is any life-threatening condition present. I reviewed all nursing notes, vitals, pertinent previous records.  All lab and urine results, EKGs, imaging ordered have been independently reviewed and interpreted by myself.  I reviewed all available  radiology reports from any imaging ordered this visit.  Based on my assessment, I feel the patient is safe to be discharged home without further emergent workup and can continue workup as an outpatient as needed. Discussed all findings, treatment plan as well as usual and customary return precautions.  They verbalize understanding and are comfortable with this plan.  Outpatient follow-up has been provided as needed.  All questions have been answered.    CONSULTS:  none   OUTSIDE RECORDS REVIEWED: Reviewed family medicine note on 01/30/2023.       FINAL CLINICAL IMPRESSION(S) / ED DIAGNOSES   Final diagnoses:  Generalized headache  Subacute maxillary sinusitis     Rx / DC Orders   ED Discharge Orders          Ordered    amoxicillin -clavulanate (AUGMENTIN ) 875-125 MG tablet  2 times daily        03/25/23 0151             Note:  This document was prepared using Dragon voice recognition software and may include unintentional dictation errors.   Trenae Brunke, Josette SAILOR, DO 03/25/23 [phone]

## 2023-06-14 ENCOUNTER — Other Ambulatory Visit: Payer: Self-pay

## 2023-06-14 ENCOUNTER — Emergency Department

## 2023-06-14 DIAGNOSIS — R0789 Other chest pain: Secondary | ICD-10-CM | POA: Insufficient documentation

## 2023-06-14 DIAGNOSIS — R519 Headache, unspecified: Secondary | ICD-10-CM | POA: Diagnosis present

## 2023-06-14 NOTE — ED Triage Notes (Signed)
 Pt presents to ER from work with chest pain that started in the morning. Pt reports feels a dull pain. Pt also reports a headache. Pt talks in complete sentences no respiratory distress noted

## 2023-06-15 ENCOUNTER — Emergency Department
Admission: EM | Admit: 2023-06-15 | Discharge: 2023-06-15 | Disposition: A | Discharge status: Home / Self Care | Attending: Emergency Medicine | Admitting: Emergency Medicine

## 2023-06-15 ENCOUNTER — Encounter: Payer: Self-pay | Admitting: Emergency Medicine

## 2023-06-15 DIAGNOSIS — R519 Headache, unspecified: Secondary | ICD-10-CM

## 2023-06-15 DIAGNOSIS — R0789 Other chest pain: Secondary | ICD-10-CM

## 2023-06-15 LAB — BASIC METABOLIC PANEL WITH GFR
Anion gap: 8 (ref 5–15)
BUN: 14 mg/dL (ref 6–20)
CO2: 27 mmol/L (ref 22–32)
Calcium: 9.1 mg/dL (ref 8.9–10.3)
Chloride: 103 mmol/L (ref 98–111)
Creatinine, Ser: 0.51 mg/dL (ref 0.44–1.00)
GFR, Estimated: >60 mL/min (ref >60)
Glucose, Bld: 102 mg/dL — ABNORMAL HIGH (ref 70–99)
Potassium: 3.6 mmol/L (ref 3.5–5.1)
Sodium: 138 mmol/L (ref 135–145)

## 2023-06-15 LAB — CBC
HCT: 38.9 % (ref 36.0–46.0)
Hemoglobin: 12.6 g/dL (ref 12.0–15.0)
MCH: 26.1 pg (ref 26.0–34.0)
MCHC: 32.4 g/dL (ref 30.0–36.0)
MCV: 80.7 fL (ref 80.0–100.0)
Platelets: 275 10*3/uL (ref 150–400)
RBC: 4.82 MIL/uL (ref 3.87–5.11)
RDW: 13.7 % (ref 11.5–15.5)
WBC: 10.5 10*3/uL (ref 4.0–10.5)
nRBC: 0 % (ref 0.0–0.2)

## 2023-06-15 LAB — TROPONIN I (HIGH SENSITIVITY): Troponin I (High Sensitivity): 3 ng/L (ref <18)

## 2023-06-15 MED ORDER — KETOROLAC TROMETHAMINE 30 MG/ML IJ SOLN
15.0000 mg | Freq: Once | INTRAMUSCULAR | Status: AC
  Administered 2023-06-15: 15 mg via INTRAVENOUS
  Filled 2023-06-15: qty 1

## 2023-06-15 MED ORDER — DIPHENHYDRAMINE HCL 50 MG/ML IJ SOLN
12.5000 mg | INTRAMUSCULAR | Status: AC
  Administered 2023-06-15: 12.5 mg via INTRAVENOUS
  Filled 2023-06-15: qty 1

## 2023-06-15 MED ORDER — SODIUM CHLORIDE 0.9 % IV BOLUS
500.0000 mL | Freq: Once | INTRAVENOUS | Status: AC
  Administered 2023-06-15: 500 mL via INTRAVENOUS

## 2023-06-15 MED ORDER — DROPERIDOL 2.5 MG/ML IJ SOLN
1.2500 mg | Freq: Once | INTRAMUSCULAR | Status: AC
  Administered 2023-06-15: 1.25 mg via INTRAVENOUS
  Filled 2023-06-15: qty 2

## 2023-06-15 MED ORDER — DEXAMETHASONE SODIUM PHOSPHATE 10 MG/ML IJ SOLN
10.0000 mg | Freq: Once | INTRAMUSCULAR | Status: AC
  Administered 2023-06-15: 10 mg via INTRAVENOUS
  Filled 2023-06-15: qty 1

## 2023-06-15 NOTE — ED Provider Notes (Addendum)
 [facility] Provider Note    Event Date/Time   First MD Initiated Contact with Patient 06/15/23 [phone]     (approximate)   History   Chest Pain and Migraine   HPI Jasmine Duncan is a 48 y.o. female who presents for evaluation of persistent headache for about a month and some chest pain that occurred about 36 hours ago.  She acknowledges that she has some issues with anxiety and depression and may have had a panic attack that caused some mild shortness of breath and the chest pain which is completely resolved.  She is also had issues with headaches/migraines in the past for many years but is been fairly consistent for a month.  She said that she is struggling with a lot of social and family issues right now and she thinks that is contributing to her discomfort but she wanted make sure everything was okay.  No nausea or vomiting.  No visual changes.  No neck pain or stiffness, no fever.     Physical Exam   Triage Vital Signs: ED Triage Vitals [06/14/23 2333]  Encounter Vitals Group     BP 138/74     Systolic BP Percentile      Diastolic BP Percentile      Pulse Rate 73     Resp 16     Temp 98.2 F (36.8 C)     Temp Source Oral     SpO2 98 %     Weight 115.7 kg (255 lb)     Height 1.676 m (5\' 6" )     Head Circumference      Peak Flow      Pain Score 3     Pain Loc      Pain Education      Exclude from Growth Chart     Most recent vital signs: Vitals:   06/15/23 0034 06/15/23 0130  BP: (!) 141/77 129/83  Pulse: 67 80  Resp: 16 16  Temp: 98.3 F (36.8 C)   SpO2: 100% 98%    General: Awake, no distress.  Pupils equal and reactive, extraocular movement is intact. CV:  Good peripheral perfusion.  Regular rate and rhythm. Resp:  Normal effort. Speaking easily and comfortably, no accessory muscle usage nor intercostal retractions.   Abd:  No distention.    ED Results / Procedures / Treatments   Labs (all labs ordered are listed, but only  abnormal results are displayed) Labs Reviewed  BASIC METABOLIC PANEL WITH GFR - Abnormal; Notable for the following components:      Result Value   Glucose, Bld 102 (*)    All other components within normal limits  CBC  TROPONIN I (HIGH SENSITIVITY)     EKG  ED ECG REPORT I, Loleta Rose, the attending physician, personally viewed and interpreted this ECG.  Date: 06/14/2023 EKG Time: 23:26 Rate: 74 Rhythm: normal sinus rhythm QRS Axis: normal Intervals: normal ST/T Wave abnormalities: Non-specific ST segment / T-wave changes, but no clear evidence of acute ischemia. Narrative Interpretation: no definitive evidence of acute ischemia; does not meet STEMI criteria.    RADIOLOGY I viewed and interpreted the patient's two-view chest x-ray and I see no evidence of pneumonia or pneumothorax.  I also read the radiologist's report, which confirmed no acute findings.   PROCEDURES:  Critical Care performed: No  .1-3 Lead EKG Interpretation  Performed by: Loleta Rose, MD Authorized by: Loleta Rose, MD     Interpretation: normal  ECG rate:  72   ECG rate assessment: normal     Rhythm: sinus rhythm     Ectopy: none     Conduction: normal       IMPRESSION / MDM / ASSESSMENT AND PLAN / ED COURSE  I reviewed the triage vital signs and the nursing notes.                              Differential diagnosis includes, but is not limited to, stress/anxiety, migraine, stress headache, tension headache, IIH, less likely neoplasm or intracranial bleed.  Patient's presentation is most consistent with acute presentation with potential threat to life or bodily function.  Labs/studies ordered: EKG, two-view chest x-ray, CBC, high-sensitivity troponin, basic metabolic panel  Interventions/Medications given:  Medications  droperidol (INAPSINE) 2.5 MG/ML injection 1.25 mg (1.25 mg Intravenous Given 06/15/23 0230)  dexamethasone (DECADRON) injection 10 mg (10 mg Intravenous Given  06/15/23 0230)  ketorolac (TORADOL) 30 MG/ML injection 15 mg (15 mg Intravenous Given 06/15/23 0230)  sodium chloride 0.9 % bolus 500 mL (0 mLs Intravenous Stopped 06/15/23 0340)  diphenhydrAMINE (BENADRYL) injection 12.5 mg (12.5 mg Intravenous Given 06/15/23 0230)    (Note:  hospital course my include additional interventions and/or labs/studies not listed above.)   Vital signs are stable and within normal limits.  EKG shows no evidence of ischemia and lab work including high-sensitivity troponin and chest x-ray are all within normal limits.  No need to repeat a troponin given the patient's low risk and the onset of the symptoms being more than a day ago.  I provided reassurance regarding the chest discomfort and she is comfortable with the plan for outpatient follow-up with her primary care doctor.  Regarding her headache, I suggested we treat as a migraine and she is in agreement.  Medications as listed above.  I will reassess and anticipate discharge for outpatient follow-up after treatment.  The patient is on the cardiac monitor to evaluate for evidence of arrhythmia and/or significant heart rate changes.   Clinical Course as of 06/15/23 0406  Sat Jun 15, 2023  0405 I reassessed the patient and she says she feels much better and her headache is gone.  She is comfortable with plan for discharge.  The patient's medical screening exam is reassuring with no indication of an emergent medical condition requiring hospitalization or additional evaluation at this point.  The patient is safe and appropriate for discharge and outpatient follow up. [CF]    Clinical Course User Index [CF] Loleta Rose, MD     FINAL CLINICAL IMPRESSION(S) / ED DIAGNOSES   Final diagnoses:  Chronic nonintractable headache, unspecified headache type  Atypical chest pain     Rx / DC Orders   ED Discharge Orders     None        Note:  This document was prepared using Dragon voice recognition software and may  include unintentional dictation errors.   Loleta Rose, MD 06/15/23 3244    Loleta Rose, MD 06/15/23 0102    Loleta Rose, MD 06/15/23 [phone]

## 2023-06-15 NOTE — Discharge Instructions (Addendum)
 Your workup in the Emergency Department today was reassuring.  We did not find any specific abnormalities.  We recommend you drink plenty of fluids, take your regular medications and/or any new ones prescribed today, and follow up with the doctor(s) listed in these documents as recommended.  Return to the Emergency Department if you develop new or worsening symptoms that concern you.

## 2023-10-23 ENCOUNTER — Ambulatory Visit
Admission: EM | Admit: 2023-10-23 | Discharge: 2023-10-23 | Disposition: A | Discharge status: Home / Self Care | Attending: Emergency Medicine | Admitting: Emergency Medicine

## 2023-10-23 ENCOUNTER — Encounter: Payer: Self-pay | Admitting: Emergency Medicine

## 2023-10-23 DIAGNOSIS — J069 Acute upper respiratory infection, unspecified: Secondary | ICD-10-CM | POA: Insufficient documentation

## 2023-10-23 LAB — SARS CORONAVIRUS 2 BY RT PCR: SARS Coronavirus 2 by RT PCR: NEGATIVE

## 2023-10-23 LAB — GROUP A STREP BY PCR: Group A Strep by PCR: NOT DETECTED

## 2023-10-23 MED ORDER — BENZONATATE 100 MG PO CAPS: 200.0000 mg | ORAL_CAPSULE | Freq: Three times a day (TID) | ORAL | 0 refills | Status: AC

## 2023-10-23 MED ORDER — IPRATROPIUM BROMIDE 0.06 % NA SOLN: 2.0000 | Freq: Four times a day (QID) | NASAL | 12 refills | Status: AC

## 2023-10-23 MED ORDER — ACETAMINOPHEN 325 MG PO TABS
975.0000 mg | ORAL_TABLET | Freq: Once | ORAL | Status: AC
  Administered 2023-10-23 (×2): 975 mg via ORAL

## 2023-10-23 MED ORDER — PROMETHAZINE-DM 6.25-15 MG/5ML PO SYRP: 5.0000 mL | ORAL_SOLUTION | Freq: Four times a day (QID) | ORAL | 0 refills | Status: AC | PRN

## 2023-10-23 MED ORDER — ONDANSETRON 8 MG PO TBDP: 8.0000 mg | ORAL_TABLET | Freq: Three times a day (TID) | ORAL | 0 refills | Status: AC | PRN

## 2023-10-23 NOTE — Discharge Instructions (Addendum)
 Your testing today was negative for strep and COVID, though I do feel that you have a viral illness which is causing your symptoms.  Please continue to use over-the-counter Tylenol  and/or ibuprofen  according the package instructions as needed for pain.  Use the Zofran  every 8 hours as needed for nausea or vomiting.  Use the Atrovent  nasal spray, 2 squirts in each nostril every 6 hours, as needed for runny nose and postnasal drip.  Use the Tessalon  Perles every 8 hours during the day.  Take them with a small sip of water.  They may give you some numbness to the base of your tongue or a metallic taste in your mouth, this is normal.  Use the Promethazine  DM cough syrup at bedtime for cough and congestion.  It will make you drowsy so do not take it during the day.  Return for reevaluation or see your primary care provider for any new or worsening symptoms.

## 2023-10-23 NOTE — ED Provider Notes (Signed)
 [facility]    CSN: [id number] Arrival date & time: 10/23/23  1544      History   Chief Complaint Chief Complaint  Patient presents with   Headache   Generalized Body Aches   Sore Throat    HPI Jasmine Duncan is a 48 y.o. female.   HPI  48 year old female with past medical history significant for diabetes, hypertension, GERD, chronic abdominal pain, anxiety, depression, and obesity presents for evaluation of of headache, body aches, sore throat, nausea, fatigue, ear pain, and nonproductive cough that started 3 days ago.  She denies any fever, vomiting, or diarrhea.  She just finished a course of Augmentin  for a sinus infection yesterday.  Past Medical History:  Diagnosis Date   Abdominal pain, chronic, right upper quadrant    has seen GI and pain clinic, no definitive diagnosis   Anxiety    Depression    Diabetes mellitus without complication (HCC)    GERD (gastroesophageal reflux disease)    Hypertension    Morbid obesity (HCC)     There are no active problems to display for this patient.   Past Surgical History:  Procedure Laterality Date   ABDOMINAL HYSTERECTOMY     CERVIX REMOVAL      OB History   No obstetric history on file.      Home Medications    Prior to Admission medications   Medication Sig Start Date End Date Taking? Authorizing Provider  benzonatate  (TESSALON ) 100 MG capsule Take 2 capsules (200 mg total) by mouth every 8 (eight) hours. 10/23/23  Yes Bernardino Ditch, NP  busPIRone (BUSPAR) 15 MG tablet Take 15 mg by mouth 2 (two) times daily. 07/24/16  Yes [provider]  escitalopram (LEXAPRO) 20 MG tablet Take 20 mg by mouth daily. 08/25/16  Yes [provider]  hydrochlorothiazide (HYDRODIURIL) 25 MG tablet Take 25 mg by mouth daily. 08/25/16  Yes [provider]  ipratropium (ATROVENT ) 0.06 % nasal spray Place 2 sprays into both nostrils 4 (four) times daily. 10/23/23  Yes Bernardino Ditch, NP  ondansetron   (ZOFRAN -ODT) 8 MG disintegrating tablet Take 1 tablet (8 mg total) by mouth every 8 (eight) hours as needed for nausea or vomiting. 10/23/23  Yes Bernardino Ditch, NP  promethazine -dextromethorphan (PROMETHAZINE -DM) 6.25-15 MG/5ML syrup Take 5 mLs by mouth 4 (four) times daily as needed. 10/23/23  Yes Bernardino Ditch, NP  amoxicillin -clavulanate (AUGMENTIN ) 875-125 MG tablet Take 1 tablet by mouth 2 (two) times daily. 03/25/23   Ward, Josette SAILOR, DO  cyclobenzaprine  (FLEXERIL ) 5 MG tablet Take 1 tablet (5 mg total) by mouth 3 (three) times daily as needed for muscle spasms. 02/24/21   Gordan Huxley, MD  famotidine  (PEPCID ) 20 MG tablet Take 1 tablet (20 mg total) by mouth daily. 01/23/22 01/23/23  Cuthriell, Dorn BIRCH, PA-C  gabapentin  (NEURONTIN ) 100 MG capsule Take 1 capsule (100 mg total) by mouth 3 (three) times daily. 01/14/23 01/14/24  Dorothyann Drivers, MD  ibuprofen  (ADVIL ,MOTRIN ) 800 MG tablet Take 1 tablet (800 mg total) by mouth every 8 (eight) hours as needed for moderate pain. 06/08/16   Sung, Jade J, MD  metFORMIN (GLUCOPHAGE-XR) 500 MG 24 hr tablet Take 500 mg by mouth daily. 08/25/16   [provider]  metoCLOPramide  (REGLAN ) 10 MG tablet Take 1 tablet (10 mg total) by mouth every 6 (six) hours as needed. 12/11/16   Viviann Pastor, MD  omeprazole  (PRILOSEC) 10 MG capsule Take 1 capsule (10 mg total) by mouth daily. 01/23/22  Cuthriell, Dorn BIRCH, PA-C  sucralfate  (CARAFATE ) 1 g tablet Take 1 tablet (1 g total) by mouth 4 (four) times daily. 12/11/16   Viviann Pastor, MD    Family History History reviewed. No pertinent family history.  Social History Social History   Tobacco Use   Smoking status: Never   Smokeless tobacco: Never  Vaping Use   Vaping status: Never Used  Substance Use Topics   Alcohol use: No   Drug use: No     Allergies   Sertraline and Oxycodone   Review of Systems Review of Systems  Constitutional:  Negative for fever.  HENT:  Positive for  congestion, ear pain, rhinorrhea and sore throat.   Respiratory:  Positive for cough and shortness of breath. Negative for wheezing.   Gastrointestinal:  Positive for nausea. Negative for diarrhea and vomiting.  Musculoskeletal:  Positive for arthralgias and myalgias.  Neurological:  Positive for headaches.     Physical Exam Triage Vital Signs ED Triage Vitals  Encounter Vitals Group     BP      Girls Systolic BP Percentile      Girls Diastolic BP Percentile      Boys Systolic BP Percentile      Boys Diastolic BP Percentile      Pulse      Resp      Temp      Temp src      SpO2      Weight      Height      Head Circumference      Peak Flow      Pain Score      Pain Loc      Pain Education      Exclude from Growth Chart    No data found.  Updated Vital Signs BP (!) 156/108 (BP Location: Left Arm)   Pulse (!) 107   Temp 98.7 F (37.1 C) (Oral)   Resp 17   Wt 252 lb (114.3 kg)   SpO2 100%   BMI 40.67 kg/m   Visual Acuity Right Eye Distance:   Left Eye Distance:   Bilateral Distance:    Right Eye Near:   Left Eye Near:    Bilateral Near:     Physical Exam Vitals and nursing note reviewed.  Constitutional:      Appearance: Normal appearance. She is ill-appearing.  HENT:     Head: Normocephalic and atraumatic.     Right Ear: Tympanic membrane, ear canal and external ear normal. There is no impacted cerumen.     Left Ear: Tympanic membrane, ear canal and external ear normal. There is no impacted cerumen.     Nose: Congestion and rhinorrhea present.     Comments: This mucosa is mildly edematous with scant clear discharge in both naris.  No appreciable erythema.    Mouth/Throat:     Mouth: Mucous membranes are moist.     Pharynx: Oropharynx is clear. No oropharyngeal exudate or posterior oropharyngeal erythema.     Comments: Tonsillar pillars are 1+ edematous but free of erythema or exudate.  Posterior oropharynx is unremarkable. Cardiovascular:     Rate and  Rhythm: Normal rate and regular rhythm.     Pulses: Normal pulses.     Heart sounds: Normal heart sounds. No murmur heard.    No friction rub. No gallop.  Pulmonary:     Effort: Pulmonary effort is normal.     Breath sounds: Normal breath sounds. No wheezing, rhonchi or  rales.  Musculoskeletal:     Cervical back: Normal range of motion and neck supple. No tenderness.  Lymphadenopathy:     Cervical: No cervical adenopathy.  Skin:    General: Skin is warm and dry.     Capillary Refill: Capillary refill takes less than 2 seconds.     Findings: No rash.  Neurological:     General: No focal deficit present.     Mental Status: She is alert and oriented to person, place, and time.      UC Treatments / Results  Labs (all labs ordered are listed, but only abnormal results are displayed) Labs Reviewed  GROUP A STREP BY PCR  SARS CORONAVIRUS 2 BY RT PCR    EKG   Radiology No results found.  Procedures Procedures (including critical care time)  Medications Ordered in UC Medications  acetaminophen  (TYLENOL ) tablet 975 mg (975 mg Oral Given 10/23/23 1601)    Initial Impression / Assessment and Plan / UC Course  I have reviewed the triage vital signs and the nursing notes.  Pertinent labs & imaging results that were available during my care of the patient were reviewed by me and considered in my medical decision making (see chart for details).   Patient is a pleasant, mildly ill-appearing, 48 year old female presenting for evaluation of respiratory symptoms as outlined HPI above.  The patient developed symptoms while she was on Augmentin  for a sinus infection and she just finished her antibiotics yesterday.  Symptoms developed 3 days ago.  Her physical exam does reveal mild inflammation of her upper respiratory tract but her tonsillar pillars and posterior oropharynx is unremarkable.  She has no cervical of adenopathy present on exam.  Cardiopulmonary exam feels clear lung sounds in  all fields.  I feel it is unlikely that she has strep given that she just finished a course of Augmentin , however, I will obtain a strep PCR.  I will also order a COVID PCR.  I will not test the patient for flu at this time as she has had symptoms for 3 days and is outside the therapeutic window for antivirals.  I will also add on her 75 mg of Tylenol  for the patient's headache.  Strep PCR is negative.  COVID PCR is negative.  I will discharge patient with a diagnosis of viral URI with a cough with prescription for Atrovent  nasal spray to help with nasal congestion.  Tessalon  Perles and Promethazine  DM cough syrup for cough and congestion.  She may use over-the-counter Tylenol  and ibuprofen  as needed for fever or pain.   Final Clinical Impressions(s) / UC Diagnoses   Final diagnoses:  Viral URI with cough     Discharge Instructions      Your testing today was negative for strep and COVID, though I do feel that you have a viral illness which is causing your symptoms.  Please continue to use over-the-counter Tylenol  and/or ibuprofen  according the package instructions as needed for pain.  Use the Zofran  every 8 hours as needed for nausea or vomiting.  Use the Atrovent  nasal spray, 2 squirts in each nostril every 6 hours, as needed for runny nose and postnasal drip.  Use the Tessalon  Perles every 8 hours during the day.  Take them with a small sip of water.  They may give you some numbness to the base of your tongue or a metallic taste in your mouth, this is normal.  Use the Promethazine  DM cough syrup at bedtime for cough and congestion.  It will make you drowsy so do not take it during the day.  Return for reevaluation or see your primary care provider for any new or worsening symptoms.        ED Prescriptions     Medication Sig Dispense Auth. Provider   benzonatate  (TESSALON ) 100 MG capsule Take 2 capsules (200 mg total) by mouth every 8 (eight) hours. 21 capsule Bernardino Ditch,  NP   ipratropium (ATROVENT ) 0.06 % nasal spray Place 2 sprays into both nostrils 4 (four) times daily. 15 mL Bernardino Ditch, NP   promethazine -dextromethorphan (PROMETHAZINE -DM) 6.25-15 MG/5ML syrup Take 5 mLs by mouth 4 (four) times daily as needed. 118 mL Bernardino Ditch, NP   ondansetron  (ZOFRAN -ODT) 8 MG disintegrating tablet Take 1 tablet (8 mg total) by mouth every 8 (eight) hours as needed for nausea or vomiting. 20 tablet Bernardino Ditch, NP      PDMP not reviewed this encounter.   Bernardino Ditch, NP 10/23/23 1642

## 2023-10-23 NOTE — ED Triage Notes (Signed)
 Sx x 3 days  Headache 8/10 Sore throat Bodyaches Nausea Fatigue   Patient was on abx for a sinus infection that she finished yesterday
